# Patient Record
Sex: Male | Born: 1975 | Race: Black or African American | Hispanic: No | Smoking: Current every day smoker
Health system: Southern US, Community
[De-identification: ages and names within clinical notes are randomized; demographics above are authoritative.]

---

## 2020-11-12 ENCOUNTER — Ambulatory Visit
Admission: EM | Admit: 2020-11-12 | Discharge: 2020-11-12 | Disposition: A | Discharge status: Home / Self Care | Payer: 59 | Attending: Emergency Medicine | Admitting: Emergency Medicine

## 2020-11-12 DIAGNOSIS — R109 Unspecified abdominal pain: Secondary | ICD-10-CM | POA: Diagnosis not present

## 2020-11-12 MED ORDER — DICYCLOMINE HCL 20 MG PO TABS: 20.0000 mg | ORAL_TABLET | Freq: Three times a day (TID) | ORAL | 0 refills | Status: DC

## 2020-11-12 NOTE — ED Triage Notes (Signed)
Pt present abdominal pain with diarrhea. Pt states symptom started  4 days ago. Pt states that his stool has been crazy.

## 2020-11-12 NOTE — ED Provider Notes (Signed)
[facility]    CSN: [id number] Arrival date & time: 11/12/20  1413      History   Chief Complaint Chief Complaint  Patient presents with  . Abdominal Pain    HPI Justin Meadows is a 45 y.o. male presenting today for evaluation of abdominal pain.  Reports over the past 4 to 5 days has had intermittent abdominal pain and cramping and discomfort associated with the urge to defecate.  Reports that he has had a lot of gas as well as mucousy stools.  Reports symptoms began after drinking Gatorade earlier in the week, but otherwise denies any other abnormal oral intakes.  Denies any nausea or vomiting.  Appetite has been slightly decreased as well as reports associated chills.  Denies fevers.  Denies history of GI problems.  HPI  History reviewed. No pertinent past medical history.  There are no problems to display for this patient.   History reviewed. No pertinent surgical history.     Home Medications    Prior to Admission medications   Medication Sig Start Date End Date Taking? Authorizing Provider  dicyclomine (BENTYL) 20 MG tablet Take 1 tablet (20 mg total) by mouth 4 (four) times daily -  before meals and at bedtime. 11/12/20  Yes Tabbetha Kutscher, Junius Creamer, PA-C    Family History History reviewed. No pertinent family history.  Social History     Allergies   Patient has no known allergies.   Review of Systems Review of Systems  Constitutional: Negative for activity change, appetite change, chills, fatigue and fever.  HENT: Negative for congestion, ear pain, rhinorrhea, sinus pressure, sore throat and trouble swallowing.   Eyes: Negative for discharge and redness.  Respiratory: Negative for cough, chest tightness and shortness of breath.   Cardiovascular: Negative for chest pain.  Gastrointestinal: Positive for abdominal pain. Negative for diarrhea, nausea and vomiting.  Musculoskeletal: Negative for myalgias.  Skin: Negative for rash.  Neurological: Negative  for dizziness, light-headedness and headaches.     Physical Exam Triage Vital Signs ED Triage Vitals  Enc Vitals Group     BP      Pulse      Resp      Temp      Temp src      SpO2      Weight      Height      Head Circumference      Peak Flow      Pain Score      Pain Loc      Pain Edu?      Excl. in GC?    No data found.  Updated Vital Signs BP (!) 150/91 (BP Location: Left Arm)   Pulse 90   Temp 99.2 F (37.3 C) (Oral)   Resp 16   SpO2 97%   Visual Acuity Right Eye Distance:   Left Eye Distance:   Bilateral Distance:    Right Eye Near:   Left Eye Near:    Bilateral Near:     Physical Exam Vitals and nursing note reviewed.  Constitutional:      Appearance: He is well-developed.     Comments: No acute distress  HENT:     Head: Normocephalic and atraumatic.     Nose: Nose normal.  Eyes:     Conjunctiva/sclera: Conjunctivae normal.  Cardiovascular:     Rate and Rhythm: Normal rate and regular rhythm.  Pulmonary:     Effort: Pulmonary effort is normal. No respiratory distress.  Comments: Breathing comfortably at rest, CTABL, no wheezing, rales or other adventitious sounds auscultated Abdominal:     General: There is no distension.     Comments: Soft, nondistended, nontender to light deep palpation throughout abdomen  Musculoskeletal:        General: Normal range of motion.     Cervical back: Neck supple.  Skin:    General: Skin is warm and dry.  Neurological:     Mental Status: He is alert and oriented to person, place, and time.      UC Treatments / Results  Labs (all labs ordered are listed, but only abnormal results are displayed) Labs Reviewed - No data to display  EKG   Radiology No results found.  Procedures Procedures (including critical care time)  Medications Ordered in UC Medications - No data to display  Initial Impression / Assessment and Plan / UC Course  I have reviewed the triage vital signs and the nursing  notes.  Pertinent labs & imaging results that were available during my care of the patient were reviewed by me and considered in my medical decision making (see chart for details).     Abdominal cramping with mucousy stools-no abdominal tenderness on exam, do not suspect abdominal emergency, possible functional cause versus other viral cause causing aberration in stools.  Recommending continued supportive care, trial of Bentyl, recommended returning stool sample to rule out other infections causes contributing to symptoms and if symptoms still not resolving over the next 2 to 3 days.  May consider GI follow-up if persisting times weeks to month.  Discussed strict return precautions. Patient verbalized understanding and is agreeable with plan.  Final Clinical Impressions(s) / UC Diagnoses   Final diagnoses:  Abdominal cramping     Discharge Instructions     Please return stool sample if symptoms not resolving over the next 2 to 3 days May try Bentyl 3-4 times daily as needed for cramping Drink plenty of fluids Follow-up if not improving or worsening    ED Prescriptions    Medication Sig Dispense Auth. Provider   dicyclomine (BENTYL) 20 MG tablet Take 1 tablet (20 mg total) by mouth 4 (four) times daily -  before meals and at bedtime. 16 tablet Jabril Pursell, [place] C, PA-C     PDMP not reviewed this encounter.   Lew Dawes, PA-C 11/12/20 1620

## 2020-11-12 NOTE — Discharge Instructions (Signed)
Please return stool sample if symptoms not resolving over the next 2 to 3 days May try Bentyl 3-4 times daily as needed for cramping Drink plenty of fluids Follow-up if not improving or worsening

## 2020-11-14 ENCOUNTER — Inpatient Hospital Stay (HOSPITAL_BASED_OUTPATIENT_CLINIC_OR_DEPARTMENT_OTHER)
Admission: EM | Admit: 2020-11-14 | Discharge: 2020-11-18 | DRG: 391 | Disposition: A | Discharge status: Home / Self Care | Payer: 59 | Attending: Internal Medicine | Admitting: Internal Medicine

## 2020-11-14 ENCOUNTER — Emergency Department (HOSPITAL_BASED_OUTPATIENT_CLINIC_OR_DEPARTMENT_OTHER): Payer: 59

## 2020-11-14 ENCOUNTER — Other Ambulatory Visit: Payer: Self-pay

## 2020-11-14 ENCOUNTER — Encounter (HOSPITAL_BASED_OUTPATIENT_CLINIC_OR_DEPARTMENT_OTHER): Payer: Self-pay | Admitting: Emergency Medicine

## 2020-11-14 DIAGNOSIS — Z20822 Contact with and (suspected) exposure to covid-19: Secondary | ICD-10-CM | POA: Diagnosis not present

## 2020-11-14 DIAGNOSIS — K651 Peritoneal abscess: Secondary | ICD-10-CM | POA: Diagnosis present

## 2020-11-14 DIAGNOSIS — F17210 Nicotine dependence, cigarettes, uncomplicated: Secondary | ICD-10-CM | POA: Diagnosis present

## 2020-11-14 DIAGNOSIS — D509 Iron deficiency anemia, unspecified: Secondary | ICD-10-CM | POA: Diagnosis not present

## 2020-11-14 DIAGNOSIS — R739 Hyperglycemia, unspecified: Secondary | ICD-10-CM | POA: Diagnosis not present

## 2020-11-14 DIAGNOSIS — E876 Hypokalemia: Secondary | ICD-10-CM | POA: Diagnosis not present

## 2020-11-14 DIAGNOSIS — R109 Unspecified abdominal pain: Secondary | ICD-10-CM

## 2020-11-14 DIAGNOSIS — R1030 Lower abdominal pain, unspecified: Secondary | ICD-10-CM | POA: Diagnosis present

## 2020-11-14 DIAGNOSIS — L0291 Cutaneous abscess, unspecified: Secondary | ICD-10-CM

## 2020-11-14 DIAGNOSIS — K50114 Crohn's disease of large intestine with abscess: Secondary | ICD-10-CM

## 2020-11-14 DIAGNOSIS — K5720 Diverticulitis of large intestine with perforation and abscess without bleeding: Principal | ICD-10-CM | POA: Diagnosis present

## 2020-11-14 DIAGNOSIS — R1032 Left lower quadrant pain: Secondary | ICD-10-CM

## 2020-11-14 LAB — COMPREHENSIVE METABOLIC PANEL
ALT: 27 U/L (ref 0–44)
AST: 17 U/L (ref 15–41)
Albumin: 3.4 g/dL — ABNORMAL LOW (ref 3.5–5.0)
Alkaline Phosphatase: 65 U/L (ref 38–126)
Anion gap: 11 (ref 5–15)
BUN: 8 mg/dL (ref 6–20)
CO2: 26 mmol/L (ref 22–32)
Calcium: 8.5 mg/dL — ABNORMAL LOW (ref 8.9–10.3)
Chloride: 100 mmol/L (ref 98–111)
Creatinine, Ser: 0.86 mg/dL (ref 0.61–1.24)
GFR, Estimated: >60 mL/min (ref >60)
Glucose, Bld: 155 mg/dL — ABNORMAL HIGH (ref 70–99)
Potassium: 2.7 mmol/L — CL (ref 3.5–5.1)
Sodium: 137 mmol/L (ref 135–145)
Total Bilirubin: 0.4 mg/dL (ref 0.3–1.2)
Total Protein: 7.8 g/dL (ref 6.5–8.1)

## 2020-11-14 LAB — IRON AND TIBC
Iron: 18 ug/dL — ABNORMAL LOW (ref 45–182)
Saturation Ratios: 8 % — ABNORMAL LOW (ref 17.9–39.5)
TIBC: 227 ug/dL — ABNORMAL LOW (ref 250–450)
UIBC: 209 ug/dL

## 2020-11-14 LAB — CBC WITH DIFFERENTIAL/PLATELET
Abs Immature Granulocytes: 0.21 10*3/uL — ABNORMAL HIGH (ref 0.00–0.07)
Basophils Absolute: 0.1 10*3/uL (ref 0.0–0.1)
Basophils Relative: 0 %
Eosinophils Absolute: 0 10*3/uL (ref 0.0–0.5)
Eosinophils Relative: 0 %
HCT: 38.2 % — ABNORMAL LOW (ref 39.0–52.0)
Hemoglobin: 12.8 g/dL — ABNORMAL LOW (ref 13.0–17.0)
Immature Granulocytes: 1 %
Lymphocytes Relative: 5 %
Lymphs Abs: 0.9 10*3/uL (ref 0.7–4.0)
MCH: 29.4 pg (ref 26.0–34.0)
MCHC: 33.5 g/dL (ref 30.0–36.0)
MCV: 87.8 fL (ref 80.0–100.0)
Monocytes Absolute: 2.3 10*3/uL — ABNORMAL HIGH (ref 0.1–1.0)
Monocytes Relative: 11 %
Neutro Abs: 17.1 10*3/uL — ABNORMAL HIGH (ref 1.7–7.7)
Neutrophils Relative %: 83 %
Platelets: 327 10*3/uL (ref 150–400)
RBC: 4.35 MIL/uL (ref 4.22–5.81)
RDW: 13.5 % (ref 11.5–15.5)
WBC: 20.6 10*3/uL — ABNORMAL HIGH (ref 4.0–10.5)
nRBC: 0 % (ref 0.0–0.2)

## 2020-11-14 LAB — RESP PANEL BY RT-PCR (FLU A&B, COVID) ARPGX2
Influenza A by PCR: NEGATIVE
Influenza B by PCR: NEGATIVE
SARS Coronavirus 2 by RT PCR: NEGATIVE

## 2020-11-14 LAB — URINALYSIS, ROUTINE W REFLEX MICROSCOPIC
Bilirubin Urine: NEGATIVE
Glucose, UA: NEGATIVE mg/dL
Ketones, ur: NEGATIVE mg/dL
Leukocytes,Ua: NEGATIVE
Nitrite: NEGATIVE
Protein, ur: NEGATIVE mg/dL
Specific Gravity, Urine: <1.005 — ABNORMAL LOW (ref 1.005–1.030)
pH: 6 (ref 5.0–8.0)

## 2020-11-14 LAB — URINALYSIS, MICROSCOPIC (REFLEX)

## 2020-11-14 LAB — MAGNESIUM: Magnesium: 1.8 mg/dL (ref 1.7–2.4)

## 2020-11-14 LAB — C DIFFICILE QUICK SCREEN W PCR REFLEX
C Diff antigen: NEGATIVE
C Diff interpretation: NOT DETECTED
C Diff toxin: NEGATIVE

## 2020-11-14 LAB — HIV ANTIBODY (ROUTINE TESTING W REFLEX): HIV Screen 4th Generation wRfx: NONREACTIVE

## 2020-11-14 MED ORDER — ONDANSETRON HCL 4 MG/2ML IJ SOLN
4.0000 mg | Freq: Once | INTRAMUSCULAR | Status: AC
  Administered 2020-11-14: 4 mg via INTRAVENOUS
  Filled 2020-11-14: qty 2

## 2020-11-14 MED ORDER — ONDANSETRON HCL 4 MG PO TABS
4.0000 mg | ORAL_TABLET | Freq: Four times a day (QID) | ORAL | Status: DC | PRN
Start: 2020-11-14 — End: 2020-11-18

## 2020-11-14 MED ORDER — OXYCODONE HCL 5 MG PO TABS
5.0000 mg | ORAL_TABLET | Freq: Four times a day (QID) | ORAL | Status: DC | PRN
  Administered 2020-11-14 – 2020-11-16 (×4): 5 mg via ORAL
  Filled 2020-11-14 (×4): qty 1

## 2020-11-14 MED ORDER — SODIUM CHLORIDE 0.9 % IV SOLN: Freq: Once | INTRAVENOUS | Status: AC

## 2020-11-14 MED ORDER — SODIUM CHLORIDE 0.9 % IV BOLUS
1000.0000 mL | Freq: Once | INTRAVENOUS | Status: AC
  Administered 2020-11-14: 1000 mL via INTRAVENOUS

## 2020-11-14 MED ORDER — ONDANSETRON HCL 4 MG/2ML IJ SOLN: 4.0000 mg | Freq: Four times a day (QID) | INTRAMUSCULAR | Status: DC | PRN

## 2020-11-14 MED ORDER — METRONIDAZOLE 500 MG/100ML IV SOLN
500.0000 mg | Freq: Once | INTRAVENOUS | Status: AC
  Administered 2020-11-14: 500 mg via INTRAVENOUS
  Filled 2020-11-14: qty 100

## 2020-11-14 MED ORDER — POTASSIUM CHLORIDE CRYS ER 20 MEQ PO TBCR
20.0000 meq | EXTENDED_RELEASE_TABLET | Freq: Two times a day (BID) | ORAL | Status: AC
  Administered 2020-11-14 – 2020-11-15 (×3): 20 meq via ORAL
  Filled 2020-11-14 (×3): qty 1

## 2020-11-14 MED ORDER — POTASSIUM CHLORIDE 10 MEQ/100ML IV SOLN
10.0000 meq | INTRAVENOUS | Status: AC
  Administered 2020-11-14 (×6): 10 meq via INTRAVENOUS
  Filled 2020-11-14 (×6): qty 100

## 2020-11-14 MED ORDER — POTASSIUM CHLORIDE CRYS ER 20 MEQ PO TBCR
40.0000 meq | EXTENDED_RELEASE_TABLET | Freq: Once | ORAL | Status: AC
  Administered 2020-11-14: 40 meq via ORAL
  Filled 2020-11-14: qty 2

## 2020-11-14 MED ORDER — ACETAMINOPHEN 325 MG PO TABS
650.0000 mg | ORAL_TABLET | Freq: Four times a day (QID) | ORAL | Status: DC | PRN
  Administered 2020-11-14 – 2020-11-15 (×3): 650 mg via ORAL
  Filled 2020-11-14 (×3): qty 2

## 2020-11-14 MED ORDER — IOHEXOL 300 MG/ML  SOLN
100.0000 mL | Freq: Once | INTRAMUSCULAR | Status: AC | PRN
  Administered 2020-11-14: 100 mL via INTRAVENOUS

## 2020-11-14 MED ORDER — ONDANSETRON HCL 4 MG/2ML IJ SOLN: 4.0000 mg | Freq: Three times a day (TID) | INTRAMUSCULAR | Status: DC | PRN

## 2020-11-14 MED ORDER — SODIUM CHLORIDE 0.9 % IV SOLN
2.0000 g | Freq: Three times a day (TID) | INTRAVENOUS | Status: DC
  Administered 2020-11-14 – 2020-11-17 (×9): 2 g via INTRAVENOUS
  Filled 2020-11-14 (×10): qty 2

## 2020-11-14 MED ORDER — FENTANYL CITRATE (PF) 100 MCG/2ML IJ SOLN
100.0000 ug | INTRAMUSCULAR | Status: DC | PRN
  Administered 2020-11-14 – 2020-11-15 (×6): 100 ug via INTRAVENOUS
  Filled 2020-11-14 (×6): qty 2

## 2020-11-14 MED ORDER — SODIUM CHLORIDE 0.9 % IV SOLN
INTRAVENOUS | Status: DC | PRN
  Administered 2020-11-14: 1000 mL via INTRAVENOUS

## 2020-11-14 MED ORDER — METRONIDAZOLE 500 MG/100ML IV SOLN
500.0000 mg | Freq: Three times a day (TID) | INTRAVENOUS | Status: DC
  Administered 2020-11-14 – 2020-11-17 (×9): 500 mg via INTRAVENOUS
  Filled 2020-11-14 (×9): qty 100

## 2020-11-14 MED ORDER — SODIUM CHLORIDE 0.9 % IV SOLN
2.0000 g | Freq: Once | INTRAVENOUS | Status: AC
  Administered 2020-11-14: 2 g via INTRAVENOUS
  Filled 2020-11-14: qty 2

## 2020-11-14 MED ORDER — POTASSIUM CHLORIDE 10 MEQ/100ML IV SOLN
10.0000 meq | INTRAVENOUS | Status: AC
  Administered 2020-11-14 (×4): 10 meq via INTRAVENOUS
  Filled 2020-11-14 (×4): qty 100

## 2020-11-14 MED ORDER — FENTANYL CITRATE (PF) 100 MCG/2ML IJ SOLN
100.0000 ug | Freq: Once | INTRAMUSCULAR | Status: AC
  Administered 2020-11-14: 100 ug via INTRAVENOUS
  Filled 2020-11-14: qty 2

## 2020-11-14 NOTE — Progress Notes (Signed)
Pharmacy Antibiotic Note  Justin Meadows is a 45 y.o. male admitted on 11/14/2020 with intra-abdominal infection.  Pharmacy has been consulted for Cefepime dosing.  Plan: Cefepime 2g IV q8h Metronidazole per MD. Follow up renal function, culture results, and clinical course.  Height: 5\' 9"  (175.3 cm) Weight: 74.8 kg (165 lb) IBW/kg (Calculated) : 70.7  Temp (24hrs), Avg:99.7 F (37.6 C), Min:99 F (37.2 C), Max:100.1 F (37.8 C)  Recent Labs  Lab 11/14/20 0410  WBC 20.6*  CREATININE 0.86    Estimated Creatinine Clearance: 108.5 mL/min (by C-G formula based on SCr of 0.86 mg/dL).    No Known Allergies  Antimicrobials this admission: 5/29 Cefepime >>   Dose adjustments this admission:   Microbiology results:  Thank you for allowing pharmacy to be a part of this patient's care.  6/29 PharmD, BCPS Clinical Pharmacist [facility] main pharmacy [phone] 11/14/2020 2:13 PM

## 2020-11-14 NOTE — H&P (Signed)
History and Physical    Justin Meadows JYN:[id number] DOB: [birth year] DOA: 11/14/2020  PCP: Pcp, No  Patient coming from: Home  Chief Complaint: abdominal pain  HPI: Justin Meadows is a 45 y.o. male with no significant medical history. Presenting with abdominal cramping. Symptoms have been present for the last 5 days. It's located in his lower quadrants. It seems to be constant but varies in intensity. At its worst, it's 10/10. He visited urgent care for help yesterday. He was sent home with bentyl. This did not provide relief. He continued to have crampy pain, so he came to the ED for help. He denies any other aggravating or alleviating factors.   ED Course: CT showed pelvic abscess. He was started on flagyl, cefepime. [facility] was called for admission.   Review of Systems:  Denies CP, palpitations, fever, dyspnea.  Review of systems is otherwise negative for all not mentioned in HPI.   PMHx History reviewed. No pertinent past medical history.  PSHx History reviewed. No pertinent surgical history.  SocHx  reports that he has been smoking cigarettes. He has never used smokeless tobacco. He reports current alcohol use. He reports current drug use. Drug: Marijuana.  No Known Allergies  FamHx No family history on file.  Prior to Admission medications   Medication Sig Start Date End Date Taking? Authorizing Provider  dicyclomine (BENTYL) 20 MG tablet Take 1 tablet (20 mg total) by mouth 4 (four) times daily -  before meals and at bedtime. 11/12/20  Yes Wieters, Hallie C, PA-C  ibuprofen (ADVIL) 200 MG tablet Take 400-600 mg by mouth every 6 (six) hours as needed for fever, headache or mild pain.   Yes [provider]    Physical Exam: Vitals:   11/14/20 0930 11/14/20 1015 11/14/20 1144 11/14/20 1343  BP: 110/70 118/79 (!) 142/82 133/86  Pulse: 81 79 76 86  Resp: 20 20 18 18   Temp:   99.8 F (37.7 C) 100 F (37.8 C)  TempSrc:   Oral Oral  SpO2: 96% 96% 99% 98%  Weight:       Height:        General: 45 y.o. male resting in bed in NAD Eyes: PERRL, normal sclera ENMT: Nares patent w/o discharge, orophaynx clear, dentition normal, ears w/o discharge/lesions/ulcers Neck: Supple, trachea midline Cardiovascular: RRR, +S1, S2, no m/g/r, equal pulses throughout Respiratory: CTABL, no w/r/r, normal WOB GI: BS+, ND, RLQ/LLQ TTP, no masses noted, no organomegaly noted MSK: No e/c/c Skin: No rashes, bruises, ulcerations noted Neuro: A&O x 3, no focal deficits Psyc: Appropriate interaction and affect, calm/cooperative  Labs on Admission: I have personally reviewed following labs and imaging studies  CBC: Recent Labs  Lab 11/14/20 0410  WBC 20.6*  NEUTROABS 17.1*  HGB 12.8*  HCT 38.2*  MCV 87.8  PLT 327   Basic Metabolic Panel: Recent Labs  Lab 11/14/20 0410  NA 137  K 2.7*  CL 100  CO2 26  GLUCOSE 155*  BUN 8  CREATININE 0.86  CALCIUM 8.5*   GFR: Estimated Creatinine Clearance: 108.5 mL/min (by C-G formula based on SCr of 0.86 mg/dL). Liver Function Tests: Recent Labs  Lab 11/14/20 0410  AST 17  ALT 27  ALKPHOS 65  BILITOT 0.4  PROT 7.8  ALBUMIN 3.4*   No results for input(s): LIPASE, AMYLASE in the last 168 hours. No results for input(s): AMMONIA in the last 168 hours. Coagulation Profile: No results for input(s): INR, PROTIME in the last 168 hours. Cardiac Enzymes: No  results for input(s): CKTOTAL, CKMB, CKMBINDEX, TROPONINI in the last 168 hours. BNP (last 3 results) No results for input(s): PROBNP in the last 8760 hours. HbA1C: No results for input(s): HGBA1C in the last 72 hours. CBG: No results for input(s): GLUCAP in the last 168 hours. Lipid Profile: No results for input(s): CHOL, HDL, LDLCALC, TRIG, CHOLHDL, LDLDIRECT in the last 72 hours. Thyroid Function Tests: No results for input(s): TSH, T4TOTAL, FREET4, T3FREE, THYROIDAB in the last 72 hours. Anemia Panel: No results for input(s): VITAMINB12, FOLATE, FERRITIN,  TIBC, IRON, RETICCTPCT in the last 72 hours. Urine analysis:    Component Value Date/Time   COLORURINE YELLOW 11/14/2020 0630   APPEARANCEUR CLEAR 11/14/2020 0630   LABSPEC <1.005 (L) 11/14/2020 0630   PHURINE 6.0 11/14/2020 0630   GLUCOSEU NEGATIVE 11/14/2020 0630   HGBUR SMALL (A) 11/14/2020 0630   BILIRUBINUR NEGATIVE 11/14/2020 0630   KETONESUR NEGATIVE 11/14/2020 0630   PROTEINUR NEGATIVE 11/14/2020 0630   NITRITE NEGATIVE 11/14/2020 0630   LEUKOCYTESUR NEGATIVE 11/14/2020 0630    Radiological Exams on Admission: CT ABDOMEN PELVIS W CONTRAST  Result Date: 11/14/2020 CLINICAL DATA:  Abdominal pain EXAM: CT ABDOMEN AND PELVIS WITH CONTRAST TECHNIQUE: Multidetector CT imaging of the abdomen and pelvis was performed using the standard protocol following bolus administration of intravenous contrast. CONTRAST:  OMNIPAQUE IOHEXOL 300 MG/ML  SOLN COMPARISON:  None. FINDINGS: Lower chest: Lung bases are clear. Hepatobiliary: 2.0 cm subcapsular cyst in segment 4 (series 2/image 27). Gallbladder is unremarkable. No intrahepatic or extrahepatic ductal dilatation. Pancreas: Within normal limits. Spleen: Within normal limits. Adrenals/Urinary Tract: Adrenal glands are within normal limits. Right renal cysts, measuring up to 2.2 cm in the lower pole (series 2/image 43). Left kidney is within normal limits. No hydronephrosis. Mildly thick-walled bladder, although underdistended. Stomach/Bowel: Stomach is within normal limits. No evidence of bowel obstruction. Normal appendix (series 2/image 63). Possible mild wall thickening involving the ascending colon (series 2/image 54), although equivocal. Transverse and proximal descending colon are normal. However, there is wall thickening involving the rectosigmoid colon with an associated 7.9 x 6.9 x 6.9 cm pelvic abscess (series 2/image 77). This distorts the adjacent loops of colon and likely communicates with one of the loops, although the direct  communication is not well visualized the current CT. Vascular/Lymphatic: No evidence of abdominal aortic aneurysm. Atherosclerotic calcifications of the abdominal aorta and branch vessels. No suspicious abdominopelvic lymphadenopathy. Reproductive: Prostate is unremarkable. Other: Mild lower retroperitoneal/pelvic stranding. No free air. Musculoskeletal: Visualized osseous structures are within normal limits. IMPRESSION: Suspected 7.9 cm pelvic abscess with adjacent rectosigmoid colonic wall thickening, likely related to infectious/inflammatory colitis. No associated free air. These results were called by telephone at the time of interpretation on 11/14/2020 at 6:00 am to provider [facility] , who verbally acknowledged these results. Electronically Signed   By: Charline Bills M.D.   On: 11/14/2020 06:20    EKG: None obtained in ED  Assessment/Plan Rectosigmoid colitis w/ pelvic abscess     - placed in obs, med-surg     - continue cefepime, flagyl     - general surgery has seen, rec IR drain, appreciate assistance, IR consulted     - pain control, fluids, anti-emetics     - ok for CLD  Tobacco abuse Marijuana abuse     - counsel against further use     - nicotine patch on request  Hypokalemia     - replace K+, check Mg2+  Normocytic anemia     -  check iron studies  DVT prophylaxis: SCDs  Code Status: FULL  Family Communication: None at bedside  Consults called: Gen surg, IR   Status is: Observation  The patient remains OBS appropriate and will d/c before 2 midnights.  Dispo: The patient is from: Home              Anticipated d/c is to: Home              Patient currently is not medically stable to d/c.   Difficult to place patient No  Time spent coordinating admission: 70 minutes  Mattox Schorr A Tauheedah Bok DO [facility]  If 7PM-7AM, please contact night-coverage [email or web address]  11/14/2020, 2:09 PM

## 2020-11-14 NOTE — ED Triage Notes (Signed)
Reports abd pain, diarrhea and gas x 5-6 days. Denies sick contacts, no one else in the home has sx and no recent travel. Temp on arrival 100.1. Denies nausea or vomiting but states he has had little intake due to abd pain.

## 2020-11-14 NOTE — Progress Notes (Signed)
Pt arrived to floor in stable condition. Pt medicated for pain shortly after arrival to floor. Assessment and VS performed. No s/s of distress. Dr. Ronaldo Miyamoto saw pt in room. Surgery to also see pt as well. Rn will keep pt npo until surgery clears pt to eat/drink. Rn will continue to monitor and medicate for needs.

## 2020-11-14 NOTE — Consult Note (Signed)
Reason for Consult: Pelvic abscess Referring Physician: Ronaldo Miyamoto MD  Justin Meadows is an 45 y.o. male.  HPI: Patient transferred from [facility] emergency room after being seen there yesterday and last night for pelvic pain.  He has felt sick of the last week.  Describes lower abdominal pain for 1 week.  Pain is sharp in nature and is progressed since this started about a week ago.  Location is suprapubic radiating down to his pelvis.  Sharp in nature made worse with movement.  Upon his evaluation in the emergency room, he had a CT scan which showed a large pelvic abscess.  No history of Crohn's disease or inflammatory bowel disease.  No history of colon cancer in his family or himself.  No history of diverticulosis.  He has never had a colonoscopy.  He has very little appetite.  Some rectal bleeding which is quite minimal.  No diarrhea.  History reviewed. No pertinent past medical history.  History reviewed. No pertinent surgical history.  No family history on file.  Social History:  reports that he has been smoking cigarettes. He has never used smokeless tobacco. He reports current alcohol use. He reports current drug use. Drug: Marijuana.  Allergies: No Known Allergies  Medications: I have reviewed the patient's current medications.  Results for orders placed or performed during the hospital encounter of 11/14/20 (from the past 48 hour(s))  CBC with Differential/Platelet     Status: Abnormal   Collection Time: 11/14/20  4:10 AM  Result Value Ref Range   WBC 20.6 (H) 4.0 - 10.5 K/uL   RBC 4.35 4.22 - 5.81 MIL/uL   Hemoglobin 12.8 (L) 13.0 - 17.0 g/dL   HCT 78.2 (L) 95.6 - 21.3 %   MCV 87.8 80.0 - 100.0 fL   MCH 29.4 26.0 - 34.0 pg   MCHC 33.5 30.0 - 36.0 g/dL   RDW 08.6 57.8 - 46.9 %   Platelets 327 150 - 400 K/uL   nRBC 0.0 0.0 - 0.2 %   Neutrophils Relative % 83 %   Neutro Abs 17.1 (H) 1.7 - 7.7 K/uL   Lymphocytes Relative 5 %   Lymphs Abs 0.9 0.7 - 4.0 K/uL   Monocytes Relative 11  %   Monocytes Absolute 2.3 (H) 0.1 - 1.0 K/uL   Eosinophils Relative 0 %   Eosinophils Absolute 0.0 0.0 - 0.5 K/uL   Basophils Relative 0 %   Basophils Absolute 0.1 0.0 - 0.1 K/uL   Immature Granulocytes 1 %   Abs Immature Granulocytes 0.21 (H) 0.00 - 0.07 K/uL    Comment: Performed at [facility], [address] [place], [place] [address]  Comprehensive metabolic panel     Status: Abnormal   Collection Time: 11/14/20  4:10 AM  Result Value Ref Range   Sodium 137 135 - 145 mmol/L   Potassium 2.7 (LL) 3.5 - 5.1 mmol/L    Comment: CRITICAL RESULT CALLED TO, READ BACK BY AND VERIFIED WITH: VALORIE POWELL RN ON 11/14/20 AT 0454 [facility]    Chloride 100 98 - 111 mmol/L   CO2 26 22 - 32 mmol/L   Glucose, Bld 155 (H) 70 - 99 mg/dL    Comment: Glucose reference range applies only to samples taken after fasting for at least 8 hours.   BUN 8 6 - 20 mg/dL   Creatinine, Ser 8.41 0.61 - 1.24 mg/dL   Calcium 8.5 (L) 8.9 - 10.3 mg/dL   Total Protein 7.8 6.5 - 8.1  g/dL   Albumin 3.4 (L) 3.5 - 5.0 g/dL   AST 17 15 - 41 U/L   ALT 27 0 - 44 U/L   Alkaline Phosphatase 65 38 - 126 U/L   Total Bilirubin 0.4 0.3 - 1.2 mg/dL   GFR, Estimated >96 >78 mL/min    Comment: (NOTE) Calculated using the CKD-EPI Creatinine Equation (2021)    Anion gap 11 5 - 15    Comment: Performed at [facility], [address] [place], [place] [address]  Resp Panel by RT-PCR (Flu A&B, Covid)     Status: None   Collection Time: 11/14/20  6:18 AM   Specimen: Nasopharyngeal(NP) swabs in vial transport medium  Result Value Ref Range   SARS Coronavirus 2 by RT PCR NEGATIVE NEGATIVE    Comment: (NOTE) SARS-CoV-2 target nucleic acids are NOT DETECTED.  The SARS-CoV-2 RNA is generally detectable in upper respiratory specimens during the acute phase of infection. The lowest concentration of SARS-CoV-2 viral copies this assay can detect is 138 copies/mL. A negative result does not preclude  SARS-Cov-2 infection and should not be used as the sole basis for treatment or other patient management decisions. A negative result may occur with  improper specimen collection/handling, submission of specimen other than nasopharyngeal swab, presence of viral mutation(s) within the areas targeted by this assay, and inadequate number of viral copies(<138 copies/mL). A negative result must be combined with clinical observations, patient history, and epidemiological information. The expected result is Negative.  Fact Sheet for Patients:  BloggerCourse.com  Fact Sheet for Healthcare Providers:  SeriousBroker.it  This test is no t yet approved or cleared by the [place] FDA and  has been authorized for detection and/or diagnosis of SARS-CoV-2 by FDA under an Emergency Use Authorization (EUA). This EUA will remain  in effect (meaning this test can be used) for the duration of the COVID-19 declaration under Section 564(b)(1) of the Act, 21 U.S.C.section 360bbb-3(b)(1), unless the authorization is terminated  or revoked sooner.       Influenza A by PCR NEGATIVE NEGATIVE   Influenza B by PCR NEGATIVE NEGATIVE    Comment: (NOTE) The Xpert Xpress SARS-CoV-2/FLU/RSV plus assay is intended as an aid in the diagnosis of influenza from Nasopharyngeal swab specimens and should not be used as a sole basis for treatment. Nasal washings and aspirates are unacceptable for Xpert Xpress SARS-CoV-2/FLU/RSV testing.  Fact Sheet for Patients: BloggerCourse.com  Fact Sheet for Healthcare Providers: SeriousBroker.it  This test is not yet approved or cleared by the [place] FDA and has been authorized for detection and/or diagnosis of SARS-CoV-2 by FDA under an Emergency Use Authorization (EUA). This EUA will remain in effect (meaning this test can be used) for the duration of the COVID-19  declaration under Section 564(b)(1) of the Act, 21 U.S.C. section 360bbb-3(b)(1), unless the authorization is terminated or revoked.  Performed at [facility], [address] [place], [place] [address]   Urinalysis, Routine w reflex microscopic     Status: Abnormal   Collection Time: 11/14/20  6:30 AM  Result Value Ref Range   Color, Urine YELLOW YELLOW   APPearance CLEAR CLEAR   Specific Gravity, Urine <1.005 (L) 1.005 - 1.030   pH 6.0 5.0 - 8.0   Glucose, UA NEGATIVE NEGATIVE mg/dL   Hgb urine dipstick SMALL (A) NEGATIVE   Bilirubin Urine NEGATIVE NEGATIVE   Ketones, ur NEGATIVE NEGATIVE mg/dL   Protein, ur NEGATIVE NEGATIVE mg/dL  Nitrite NEGATIVE NEGATIVE   Leukocytes,Ua NEGATIVE NEGATIVE    Comment: Performed at [facility], [address] [place], [place] [address]  Urinalysis, Microscopic (reflex)     Status: Abnormal   Collection Time: 11/14/20  6:30 AM  Result Value Ref Range   RBC / HPF 0-5 0 - 5 RBC/hpf   WBC, UA 0-5 0 - 5 WBC/hpf   Bacteria, UA RARE (A) NONE SEEN   Squamous Epithelial / LPF 0-5 0 - 5   Mucus PRESENT     Comment: Performed at [facility], [address] [place], [place] [address]    CT ABDOMEN PELVIS W CONTRAST  Result Date: 11/14/2020 CLINICAL DATA:  Abdominal pain EXAM: CT ABDOMEN AND PELVIS WITH CONTRAST TECHNIQUE: Multidetector CT imaging of the abdomen and pelvis was performed using the standard protocol following bolus administration of intravenous contrast. CONTRAST:  100mL OMNIPAQUE IOHEXOL 300 MG/ML  SOLN COMPARISON:  None. FINDINGS: Lower chest: Lung bases are clear. Hepatobiliary: 2.0 cm subcapsular cyst in segment 4 (series 2/image 27). Gallbladder is unremarkable. No intrahepatic or extrahepatic ductal dilatation. Pancreas: Within normal limits. Spleen: Within normal limits. Adrenals/Urinary Tract: Adrenal glands are within normal limits. Right renal cysts, measuring up to 2.2 cm in the lower pole  (series 2/image 43). Left kidney is within normal limits. No hydronephrosis. Mildly thick-walled bladder, although underdistended. Stomach/Bowel: Stomach is within normal limits. No evidence of bowel obstruction. Normal appendix (series 2/image 63). Possible mild wall thickening involving the ascending colon (series 2/image 54), although equivocal. Transverse and proximal descending colon are normal. However, there is wall thickening involving the rectosigmoid colon with an associated 7.9 x 6.9 x 6.9 cm pelvic abscess (series 2/image 77). This distorts the adjacent loops of colon and likely communicates with one of the loops, although the direct communication is not well visualized the current CT. Vascular/Lymphatic: No evidence of abdominal aortic aneurysm. Atherosclerotic calcifications of the abdominal aorta and branch vessels. No suspicious abdominopelvic lymphadenopathy. Reproductive: Prostate is unremarkable. Other: Mild lower retroperitoneal/pelvic stranding. No free air. Musculoskeletal: Visualized osseous structures are within normal limits. IMPRESSION: Suspected 7.9 cm pelvic abscess with adjacent rectosigmoid colonic wall thickening, likely related to infectious/inflammatory colitis. No associated free air. These results were called by telephone at the time of interpretation on 11/14/2020 at 6:00 am to provider [facility] , who verbally acknowledged these results. Electronically Signed   By: Charline BillsSriyesh  Krishnan M.D.   On: 11/14/2020 06:20    Review of Systems  Gastrointestinal: Positive for abdominal pain and blood in stool.  All other systems reviewed and are negative.  Blood pressure 133/86, pulse 86, temperature 100 F (37.8 C), temperature source Oral, resp. rate 18, height 5\' 9"  (1.753 m), weight 74.8 kg, SpO2 98 %. Physical Exam Constitutional:      Appearance: He is well-developed.  HENT:     Head: Normocephalic and atraumatic.  Cardiovascular:     Rate and Rhythm: Normal rate and  regular rhythm.  Pulmonary:     Effort: Pulmonary effort is normal.     Breath sounds: Normal breath sounds.  Abdominal:     General: There is no distension.     Tenderness: There is abdominal tenderness in the suprapubic area.     Hernia: No hernia is present.  Skin:    General: Skin is warm and dry.  Neurological:     General: No focal deficit present.     Mental Status: He is alert.  Psychiatric:  Mood and Affect: Mood normal.        Behavior: Behavior normal.     Assessment/Plan: Pelvic abscess-most likely from diverticulitis at this point time.  Recommend IR consultation for percutaneous drainage if able  Recommend IV antibiotics  Okay to have clear liquids for now from our standpoint  No acute surgical need  Recommend colonoscopy in 6 to 8 weeks if he resolves without surgery  Surgery will follow while he is in house  Waverly A Dwan Hemmelgarn 11/14/2020, 1:50 PM

## 2020-11-14 NOTE — ED Notes (Signed)
Pt placed on cardiac monitor at this time. Pt tolerated well.

## 2020-11-14 NOTE — Progress Notes (Signed)
Chief Complaint: Abdominal Pain. Request is for pelvic abscess drain placement   Referring Physician(s):  Dr. Warren Lacy   Supervising Physician: Marliss Coots  Patient Status: [facility] - In-pt  History of Present Illness: Justin Meadows is a 45 y.o. male No pertinent medical history. Presented to the ED at [facility] with Persistent and worsening pelvic pain x 1 week with low grade fever. Patient was seen at an urgent care on 5.28.22 and prescribed bentyl with no improvement in pain. CT abd pelvis from 5.29.22 reads Suspected 7.9 cm pelvic abscess with adjacent rectosigmoid colonic wall thickening, likely related to infectious/inflammatory colitis. No associated free air. Team is requesting abscess drain placement to the pelvic fluid collection.  Patient lying bed. Endorses abdominal pain that he states is improving and chills. Denies any fevers, headache, chest pain, SOB, cough, abdominal pain, nausea, vomiting or bleeding. Patient made aware that the procedure may be aborted if there is no safe percutaneous window.  Return precautions and treatment recommendations and follow-up discussed with the patient who is agreeable with the plan.     History reviewed. No pertinent past medical history.  History reviewed. No pertinent surgical history.  Allergies: Patient has no known allergies.  Medications: Prior to Admission medications   Medication Sig Start Date End Date Taking? Authorizing Provider  dicyclomine (BENTYL) 20 MG tablet Take 1 tablet (20 mg total) by mouth 4 (four) times daily -  before meals and at bedtime. 11/12/20  Yes Wieters, Hallie C, PA-C  ibuprofen (ADVIL) 200 MG tablet Take 400-600 mg by mouth every 6 (six) hours as needed for fever, headache or mild pain.   Yes [provider]     No family history on file.  Social History   Socioeconomic History  . Marital status: Divorced    Spouse name: Not on file  . Number of children: Not on file  . Years of  education: Not on file  . Highest education level: Not on file  Occupational History  . Not on file  Tobacco Use  . Smoking status: Current Every Day Smoker    Types: Cigarettes  . Smokeless tobacco: Never Used  Vaping Use  . Vaping Use: Never used  Substance and Sexual Activity  . Alcohol use: Yes  . Drug use: Yes    Types: Marijuana  . Sexual activity: Not on file  Other Topics Concern  . Not on file  Social History Narrative  . Not on file   Social Determinants of Health   Financial Resource Strain: Not on file  Food Insecurity: Not on file  Transportation Needs: Not on file  Physical Activity: Not on file  Stress: Not on file  Social Connections: Not on file     Review of Systems: A 12 point ROS discussed and pertinent positives are indicated in the HPI above.  All other systems are negative.  Review of Systems  Constitutional: Positive for fever.  HENT: Negative for congestion.   Respiratory: Negative for cough and shortness of breath.   Cardiovascular: Negative for chest pain.  Gastrointestinal: Positive for abdominal pain (getting better).  Neurological: Negative for headaches.  Psychiatric/Behavioral: Negative for behavioral problems and confusion.    Vital Signs: BP 133/86 (BP Location: Left Arm)   Pulse 86   Temp 100 F (37.8 C) (Oral)   Resp 18   Ht 5\' 9"  (1.753 m)   Wt 165 lb (74.8 kg)   SpO2 98%   BMI 24.37  kg/m   Physical Exam Vitals and nursing note reviewed.  Constitutional:      Appearance: He is well-developed.  HENT:     Head: Normocephalic.  Cardiovascular:     Rate and Rhythm: Normal rate and regular rhythm.  Pulmonary:     Effort: Pulmonary effort is normal.     Breath sounds: Normal breath sounds.  Musculoskeletal:        General: Normal range of motion.     Cervical back: Normal range of motion.  Skin:    General: Skin is dry.  Neurological:     Mental Status: He is alert and oriented to person, place, and time.      Imaging: CT ABDOMEN PELVIS W CONTRAST  Result Date: 11/14/2020 CLINICAL DATA:  Abdominal pain EXAM: CT ABDOMEN AND PELVIS WITH CONTRAST TECHNIQUE: Multidetector CT imaging of the abdomen and pelvis was performed using the standard protocol following bolus administration of intravenous contrast. CONTRAST:  OMNIPAQUE IOHEXOL 300 MG/ML  SOLN COMPARISON:  None. FINDINGS: Lower chest: Lung bases are clear. Hepatobiliary: 2.0 cm subcapsular cyst in segment 4 (series 2/image 27). Gallbladder is unremarkable. No intrahepatic or extrahepatic ductal dilatation. Pancreas: Within normal limits. Spleen: Within normal limits. Adrenals/Urinary Tract: Adrenal glands are within normal limits. Right renal cysts, measuring up to 2.2 cm in the lower pole (series 2/image 43). Left kidney is within normal limits. No hydronephrosis. Mildly thick-walled bladder, although underdistended. Stomach/Bowel: Stomach is within normal limits. No evidence of bowel obstruction. Normal appendix (series 2/image 63). Possible mild wall thickening involving the ascending colon (series 2/image 54), although equivocal. Transverse and proximal descending colon are normal. However, there is wall thickening involving the rectosigmoid colon with an associated 7.9 x 6.9 x 6.9 cm pelvic abscess (series 2/image 77). This distorts the adjacent loops of colon and likely communicates with one of the loops, although the direct communication is not well visualized the current CT. Vascular/Lymphatic: No evidence of abdominal aortic aneurysm. Atherosclerotic calcifications of the abdominal aorta and branch vessels. No suspicious abdominopelvic lymphadenopathy. Reproductive: Prostate is unremarkable. Other: Mild lower retroperitoneal/pelvic stranding. No free air. Musculoskeletal: Visualized osseous structures are within normal limits. IMPRESSION: Suspected 7.9 cm pelvic abscess with adjacent rectosigmoid colonic wall thickening, likely related to  infectious/inflammatory colitis. No associated free air. These results were called by telephone at the time of interpretation on 11/14/2020 at 6:00 am to provider [facility] , who verbally acknowledged these results. Electronically Signed   By: Charline Bills M.D.   On: 11/14/2020 06:20    Labs:  CBC: Recent Labs    11/14/20 0410  WBC 20.6*  HGB 12.8*  HCT 38.2*  PLT 327    COAGS: No results for input(s): INR, APTT in the last 8760 hours.  BMP: Recent Labs    11/14/20 0410  NA 137  K 2.7*  CL 100  CO2 26  GLUCOSE 155*  BUN 8  CALCIUM 8.5*  CREATININE 0.86  GFRNONAA >60    LIVER FUNCTION TESTS: Recent Labs    11/14/20 0410  BILITOT 0.4  AST 17  ALT 27  ALKPHOS 65  PROT 7.8  ALBUMIN 3.4*    Assessment and Plan:  45 y.o. male inpatient. No pertinent medical history. Presented to the ED at [facility] with Persistent and worsening pelvic pain x 1 week with low grade fever. Patient was seen at an urgent care on 5.28.22 and prescribed bentyl with no improvement in pain. CT abd pelvis from 5.29.22 reads Suspected 7.9 cm pelvic  abscess with adjacent rectosigmoid colonic wall thickening, likely related to infectious/inflammatory colitis. No associated free air. Team is requesting abscess drain placement to the pelvic fluid collection. WBC 20.6. last recorded temperature 99.2. NKDA. General surgery consulted and recommends percutaneous drainage with outpatient follow up.   IR consulted for possible pelvic abscess drain. Case has been reviewed and procedure approved by Dr. Elby Showers.  Patient tentatively scheduled for 5.30.22 in CT with IV contrast.  Team instructed to: Keep Patient to be NPO after midnight  IR will call patient when ready.  Risks and benefits discussed with the patient including bleeding, infection, damage to adjacent structures, bowel perforation/fistula connection, and sepsis.  All of the patient's questions were answered, patient is agreeable to  proceed. Consent signed and in chart.     Thank you for this interesting consult.  I greatly enjoyed meeting [employer] and look forward to participating in their care.  A copy of this report was sent to the requesting provider on this date.  Electronically Signed: Alene Mires, NP 11/14/2020, 2:13 PM   I spent a total of 40 Minutes    in face to face in clinical consultation, greater than 50% of which was counseling/coordinating care for pelvic abscess drain.

## 2020-11-14 NOTE — Progress Notes (Signed)
Notified by EDP of need for admission d/t rectal abscess. [facility] accepts patient to med-surg bed at [facility]. EDP is to remain responsible for orders/medical decisions while patient is holding at [facility]. Upon arrival to [facility], [facility] will assume care. Nursing staff will call flow manager/carelink to notify them of patient's arrival so that the proper [facility] member may receive the patient. Nursing staff will notify the following consultants, general surgery (Dr. Luisa Hart), of patient's arrival for their evaluation. Thank you.

## 2020-11-14 NOTE — ED Provider Notes (Signed)
[facility]Y [facility] Provider Note: Justin Dell, MD, FACEP  CSN: [id number] MRN: [id number] ARRIVAL: 11/14/20 at 0318 ROOM: MH09/MH09   CHIEF COMPLAINT  Abdominal Pain   HISTORY OF PRESENT ILLNESS  11/14/20 3:40 AM Justin Meadows is a 45 y.o. male with 5 days of abdominal pain along with chills.  The pain is in his lower abdomen.  It was initially intermittent but is now constant.  He describes it as a burning pain with also a sharp component.  He rates it as a 10 out of 10.  It is not worse with movement or palpation.  He denies nausea or vomiting but has not wanted to eat due to the pain and believes he is dehydrated.  He had difficulty having a bowel movement and took a laxative which caused him to have diarrhea.  There was no blood in the diarrhea.  He was noted to have a low-grade fever of 100.1 on arrival.  He states his urine has been dark but he has had no dysuria.  He was seen at an urgent care 2 days ago and was prescribed dicyclomine which has not relieved his pain.   History reviewed. No pertinent past medical history.  History reviewed. No pertinent surgical history.  No family history on file.  Social History   Tobacco Use  . Smoking status: Current Every Day Smoker  . Smokeless tobacco: Never Used  Vaping Use  . Vaping Use: Never used    Prior to Admission medications   Medication Sig Start Date End Date Taking? Authorizing Provider  dicyclomine (BENTYL) 20 MG tablet Take 1 tablet (20 mg total) by mouth 4 (four) times daily -  before meals and at bedtime. 11/12/20   Wieters, Hallie C, PA-C    Allergies Patient has no known allergies.   REVIEW OF SYSTEMS  Negative except as noted here or in the History of Present Illness.   PHYSICAL EXAMINATION  Initial Vital Signs Blood pressure (!) 155/93, pulse 75, temperature 100.1 F (37.8 C), resp. rate 20, height 5\' 9"  (1.753 m), weight 74.8 kg, SpO2 98 %.  Examination General: Well-developed, well-nourished  male in no acute distress; appearance consistent with age of record HENT: normocephalic; atraumatic Eyes: pupils equal, round and reactive to light; extraocular muscles intact Neck: supple Heart: regular rate and rhythm Lungs: clear to auscultation bilaterally Abdomen: soft; nondistended; nontender; bowel sounds present Extremities: No deformity; full range of motion; pulses normal Neurologic: Awake, alert and oriented; motor function intact in all extremities and symmetric; no facial droop Skin: Warm and dry Psychiatric: Normal mood and affect   RESULTS  Summary of this visit's results, reviewed and interpreted by myself:   EKG Interpretation  Date/Time:    Ventricular Rate:    PR Interval:    QRS Duration:   QT Interval:    QTC Calculation:   R Axis:     Text Interpretation:        Laboratory Studies: Results for orders placed or performed during the hospital encounter of 11/14/20 (from the past 24 hour(s))  CBC with Differential/Platelet     Status: Abnormal   Collection Time: 11/14/20  4:10 AM  Result Value Ref Range   WBC 20.6 (H) 4.0 - 10.5 K/uL   RBC 4.35 4.22 - 5.81 MIL/uL   Hemoglobin 12.8 (L) 13.0 - 17.0 g/dL   HCT 11/16/20 (L) 97.0 - 26.3 %   MCV 87.8 80.0 - 100.0 fL   MCH 29.4 26.0 - 34.0 pg  MCHC 33.5 30.0 - 36.0 g/dL   RDW 96.7 59.1 - 63.8 %   Platelets 327 150 - 400 K/uL   nRBC 0.0 0.0 - 0.2 %   Neutrophils Relative % 83 %   Neutro Abs 17.1 (H) 1.7 - 7.7 K/uL   Lymphocytes Relative 5 %   Lymphs Abs 0.9 0.7 - 4.0 K/uL   Monocytes Relative 11 %   Monocytes Absolute 2.3 (H) 0.1 - 1.0 K/uL   Eosinophils Relative 0 %   Eosinophils Absolute 0.0 0.0 - 0.5 K/uL   Basophils Relative 0 %   Basophils Absolute 0.1 0.0 - 0.1 K/uL   Immature Granulocytes 1 %   Abs Immature Granulocytes 0.21 (H) 0.00 - 0.07 K/uL  Comprehensive metabolic panel     Status: Abnormal   Collection Time: 11/14/20  4:10 AM  Result Value Ref Range   Sodium 137 135 - 145 mmol/L    Potassium 2.7 (LL) 3.5 - 5.1 mmol/L   Chloride 100 98 - 111 mmol/L   CO2 26 22 - 32 mmol/L   Glucose, Bld 155 (H) 70 - 99 mg/dL   BUN 8 6 - 20 mg/dL   Creatinine, Ser 4.66 0.61 - 1.24 mg/dL   Calcium 8.5 (L) 8.9 - 10.3 mg/dL   Total Protein 7.8 6.5 - 8.1 g/dL   Albumin 3.4 (L) 3.5 - 5.0 g/dL   AST 17 15 - 41 U/L   ALT 27 0 - 44 U/L   Alkaline Phosphatase 65 38 - 126 U/L   Total Bilirubin 0.4 0.3 - 1.2 mg/dL   GFR, Estimated >59 >93 mL/min   Anion gap 11 5 - 15  Resp Panel by RT-PCR (Flu A&B, Covid)     Status: None   Collection Time: 11/14/20  6:18 AM   Specimen: Nasopharyngeal(NP) swabs in vial transport medium  Result Value Ref Range   SARS Coronavirus 2 by RT PCR NEGATIVE NEGATIVE   Influenza A by PCR NEGATIVE NEGATIVE   Influenza B by PCR NEGATIVE NEGATIVE  Urinalysis, Routine w reflex microscopic     Status: Abnormal   Collection Time: 11/14/20  6:30 AM  Result Value Ref Range   Color, Urine YELLOW YELLOW   APPearance CLEAR CLEAR   Specific Gravity, Urine <1.005 (L) 1.005 - 1.030   pH 6.0 5.0 - 8.0   Glucose, UA NEGATIVE NEGATIVE mg/dL   Hgb urine dipstick SMALL (A) NEGATIVE   Bilirubin Urine NEGATIVE NEGATIVE   Ketones, ur NEGATIVE NEGATIVE mg/dL   Protein, ur NEGATIVE NEGATIVE mg/dL   Nitrite NEGATIVE NEGATIVE   Leukocytes,Ua NEGATIVE NEGATIVE  Urinalysis, Microscopic (reflex)     Status: Abnormal   Collection Time: 11/14/20  6:30 AM  Result Value Ref Range   RBC / HPF 0-5 0 - 5 RBC/hpf   WBC, UA 0-5 0 - 5 WBC/hpf   Bacteria, UA RARE (A) NONE SEEN   Squamous Epithelial / LPF 0-5 0 - 5   Mucus PRESENT    Imaging Studies: CT ABDOMEN PELVIS W CONTRAST  Result Date: 11/14/2020 CLINICAL DATA:  Abdominal pain EXAM: CT ABDOMEN AND PELVIS WITH CONTRAST TECHNIQUE: Multidetector CT imaging of the abdomen and pelvis was performed using the standard protocol following bolus administration of intravenous contrast. CONTRAST:  OMNIPAQUE IOHEXOL 300 MG/ML  SOLN  COMPARISON:  None. FINDINGS: Lower chest: Lung bases are clear. Hepatobiliary: 2.0 cm subcapsular cyst in segment 4 (series 2/image 27). Gallbladder is unremarkable. No intrahepatic or extrahepatic ductal dilatation. Pancreas: Within normal limits.  Spleen: Within normal limits. Adrenals/Urinary Tract: Adrenal glands are within normal limits. Right renal cysts, measuring up to 2.2 cm in the lower pole (series 2/image 43). Left kidney is within normal limits. No hydronephrosis. Mildly thick-walled bladder, although underdistended. Stomach/Bowel: Stomach is within normal limits. No evidence of bowel obstruction. Normal appendix (series 2/image 63). Possible mild wall thickening involving the ascending colon (series 2/image 54), although equivocal. Transverse and proximal descending colon are normal. However, there is wall thickening involving the rectosigmoid colon with an associated 7.9 x 6.9 x 6.9 cm pelvic abscess (series 2/image 77). This distorts the adjacent loops of colon and likely communicates with one of the loops, although the direct communication is not well visualized the current CT. Vascular/Lymphatic: No evidence of abdominal aortic aneurysm. Atherosclerotic calcifications of the abdominal aorta and branch vessels. No suspicious abdominopelvic lymphadenopathy. Reproductive: Prostate is unremarkable. Other: Mild lower retroperitoneal/pelvic stranding. No free air. Musculoskeletal: Visualized osseous structures are within normal limits. IMPRESSION: Suspected 7.9 cm pelvic abscess with adjacent rectosigmoid colonic wall thickening, likely related to infectious/inflammatory colitis. No associated free air. These results were called by telephone at the time of interpretation on 11/14/2020 at 6:00 am to provider [facility] Meris Reede , who verbally acknowledged these results. Electronically Signed   By: Charline BillsSriyesh  Krishnan M.D.   On: 11/14/2020 06:20    ED COURSE and MDM  Nursing notes, initial and subsequent vitals  signs, including pulse oximetry, reviewed and interpreted by myself.  Vitals:   11/14/20 0400 11/14/20 0500 11/14/20 0612 11/14/20 0618  BP: (!) 126/94 129/77  138/89  Pulse: 80 74  73  Resp: 17 18  13   Temp:   99 F (37.2 C)   TempSrc:   Oral   SpO2: 95% 99%  98%  Weight:      Height:       Medications  potassium chloride 10 mEq in 100 mL IVPB (10 mEq Intravenous New Bag/Given 11/14/20 0616)  ceFEPIme (MAXIPIME) 2 g in sodium chloride 0.9 % 100 mL IVPB (0 g Intravenous Stopped 11/14/20 0701)    And  metroNIDAZOLE (FLAGYL) IVPB 500 mg (500 mg Intravenous New Bag/Given 11/14/20 0706)  0.9 %  sodium chloride infusion (1,000 mLs Intravenous New Bag/Given 11/14/20 0617)  fentaNYL (SUBLIMAZE) injection 100 mcg (100 mcg Intravenous Given 11/14/20 0621)  sodium chloride 0.9 % bolus 1,000 mL (0 mLs Intravenous Stopped 11/14/20 0510)  ondansetron (ZOFRAN) injection 4 mg (4 mg Intravenous Given 11/14/20 0407)  fentaNYL (SUBLIMAZE) injection 100 mcg (100 mcg Intravenous Given 11/14/20 0408)  iohexol (OMNIPAQUE) 300 MG/ML solution 100 mL (100 mLs Intravenous Contrast Given 11/14/20 0504)  potassium chloride SA (KLOR-CON) CR tablet 40 mEq (40 mEq Oral Given 11/14/20 0614)   6:10 AM Cefepime and Flagyl IV ordered for rectal inflammation with intra-abdominal abscess.  IV potassium ordered for hypokalemia.   PROCEDURES  Procedures CRITICAL CARE Performed by: Carlisle BeersJohn L Cierrah Dace Total critical care time: 30 minutes Critical care time was exclusive of separately billable procedures and treating other patients. Critical care was necessary to treat or prevent imminent or life-threatening deterioration. Critical care was time spent personally by me on the following activities: development of treatment plan with patient and/or surrogate as well as nursing, discussions with consultants, evaluation of patient's response to treatment, examination of patient, obtaining history from patient or surrogate, ordering and  performing treatments and interventions, ordering and review of laboratory studies, ordering and review of radiographic studies, pulse oximetry and re-evaluation of patient's condition.   ED DIAGNOSES  ICD-10-CM   1. Colitis, regional, with abscess (HCC)  K50.114   2. Hypokalemia  E87.6        Eder Macek, Jonny Ruiz, MD 11/14/20 [phone]

## 2020-11-14 NOTE — Plan of Care (Signed)
  Problem: Clinical Measurements: Goal: Diagnostic test results will improve Outcome: Progressing   Problem: Clinical Measurements: Goal: Respiratory complications will improve Outcome: Progressing   Problem: Clinical Measurements: Goal: Cardiovascular complication will be avoided Outcome: Progressing   Problem: Elimination: Goal: Will not experience complications related to bowel motility Outcome: Progressing   Problem: Safety: Goal: Ability to remain free from injury will improve Outcome: Progressing

## 2020-11-15 ENCOUNTER — Observation Stay (HOSPITAL_COMMUNITY): Payer: 59

## 2020-11-15 DIAGNOSIS — D509 Iron deficiency anemia, unspecified: Secondary | ICD-10-CM | POA: Diagnosis present

## 2020-11-15 DIAGNOSIS — Z20822 Contact with and (suspected) exposure to covid-19: Secondary | ICD-10-CM | POA: Diagnosis present

## 2020-11-15 DIAGNOSIS — R109 Unspecified abdominal pain: Secondary | ICD-10-CM | POA: Diagnosis not present

## 2020-11-15 DIAGNOSIS — K651 Peritoneal abscess: Secondary | ICD-10-CM | POA: Diagnosis present

## 2020-11-15 DIAGNOSIS — R739 Hyperglycemia, unspecified: Secondary | ICD-10-CM | POA: Diagnosis not present

## 2020-11-15 DIAGNOSIS — K5720 Diverticulitis of large intestine with perforation and abscess without bleeding: Secondary | ICD-10-CM | POA: Diagnosis present

## 2020-11-15 DIAGNOSIS — D539 Nutritional anemia, unspecified: Secondary | ICD-10-CM | POA: Diagnosis not present

## 2020-11-15 DIAGNOSIS — E876 Hypokalemia: Secondary | ICD-10-CM | POA: Diagnosis present

## 2020-11-15 DIAGNOSIS — R1030 Lower abdominal pain, unspecified: Secondary | ICD-10-CM | POA: Diagnosis present

## 2020-11-15 DIAGNOSIS — F17210 Nicotine dependence, cigarettes, uncomplicated: Secondary | ICD-10-CM | POA: Diagnosis present

## 2020-11-15 LAB — COMPREHENSIVE METABOLIC PANEL
ALT: 24 U/L (ref 0–44)
AST: 15 U/L (ref 15–41)
Albumin: 2.8 g/dL — ABNORMAL LOW (ref 3.5–5.0)
Alkaline Phosphatase: 89 U/L (ref 38–126)
Anion gap: 6 (ref 5–15)
BUN: 10 mg/dL (ref 6–20)
CO2: 27 mmol/L (ref 22–32)
Calcium: 8.6 mg/dL — ABNORMAL LOW (ref 8.9–10.3)
Chloride: 107 mmol/L (ref 98–111)
Creatinine, Ser: 1.05 mg/dL (ref 0.61–1.24)
GFR, Estimated: >60 mL/min (ref >60)
Glucose, Bld: 107 mg/dL — ABNORMAL HIGH (ref 70–99)
Potassium: 3.8 mmol/L (ref 3.5–5.1)
Sodium: 140 mmol/L (ref 135–145)
Total Bilirubin: 0.5 mg/dL (ref 0.3–1.2)
Total Protein: 6.9 g/dL (ref 6.5–8.1)

## 2020-11-15 LAB — CBC
HCT: 34.1 % — ABNORMAL LOW (ref 39.0–52.0)
Hemoglobin: 11.2 g/dL — ABNORMAL LOW (ref 13.0–17.0)
MCH: 29.4 pg (ref 26.0–34.0)
MCHC: 32.8 g/dL (ref 30.0–36.0)
MCV: 89.5 fL (ref 80.0–100.0)
Platelets: 296 10*3/uL (ref 150–400)
RBC: 3.81 MIL/uL — ABNORMAL LOW (ref 4.22–5.81)
RDW: 14.4 % (ref 11.5–15.5)
WBC: 17.1 10*3/uL — ABNORMAL HIGH (ref 4.0–10.5)
nRBC: 0 % (ref 0.0–0.2)

## 2020-11-15 MED ORDER — LIP MEDEX EX OINT
TOPICAL_OINTMENT | CUTANEOUS | Status: AC
  Filled 2020-11-15: qty 7

## 2020-11-15 MED ORDER — FENTANYL CITRATE (PF) 100 MCG/2ML IJ SOLN
INTRAMUSCULAR | Status: AC
  Filled 2020-11-15: qty 4

## 2020-11-15 MED ORDER — MIDAZOLAM HCL 2 MG/2ML IJ SOLN
INTRAMUSCULAR | Status: AC | PRN
  Administered 2020-11-15 (×2): 2 mg via INTRAVENOUS

## 2020-11-15 MED ORDER — SIMETHICONE 80 MG PO CHEW: 80.0000 mg | CHEWABLE_TABLET | Freq: Four times a day (QID) | ORAL | Status: DC | PRN

## 2020-11-15 MED ORDER — FENTANYL CITRATE (PF) 100 MCG/2ML IJ SOLN
INTRAMUSCULAR | Status: AC | PRN
  Administered 2020-11-15 (×2): 50 ug via INTRAVENOUS

## 2020-11-15 MED ORDER — TRAZODONE HCL 50 MG PO TABS
50.0000 mg | ORAL_TABLET | Freq: Every evening | ORAL | Status: DC | PRN
  Administered 2020-11-15: 50 mg via ORAL
  Filled 2020-11-15: qty 1

## 2020-11-15 MED ORDER — SIMETHICONE 80 MG PO CHEW
80.0000 mg | CHEWABLE_TABLET | Freq: Four times a day (QID) | ORAL | Status: DC | PRN
  Administered 2020-11-15: 80 mg via ORAL
  Filled 2020-11-15: qty 1

## 2020-11-15 MED ORDER — LIDOCAINE HCL (PF) 1 % IJ SOLN
INTRAMUSCULAR | Status: AC | PRN
  Administered 2020-11-15: 10 mL

## 2020-11-15 MED ORDER — DIPHENHYDRAMINE HCL 50 MG/ML IJ SOLN
INTRAMUSCULAR | Status: AC | PRN
  Administered 2020-11-15: 25 mg via INTRAVENOUS

## 2020-11-15 MED ORDER — DIPHENHYDRAMINE HCL 50 MG/ML IJ SOLN
INTRAMUSCULAR | Status: AC
  Filled 2020-11-15: qty 1

## 2020-11-15 MED ORDER — MIDAZOLAM HCL 2 MG/2ML IJ SOLN
INTRAMUSCULAR | Status: AC
  Filled 2020-11-15: qty 6

## 2020-11-15 NOTE — Progress Notes (Signed)
PROGRESS NOTE  Justin Meadows FBP:[id number] DOB: Oct 25, 1975 DOA: 11/14/2020 PCP: Pcp, No  Brief History   45 year old male no significant PMH presenting with abdominal pain.  CT showed pelvic abscess.  Seen by general surgery, thought more likely from diverticulitis.  Status post IR drain placement culture aspiration 5/30.  A & P  Pelvic abscess adjacent rectosigmoid colonic wall thickening.  Status post IR drain placement 5/30. --Much better, currently pain-free.  WBC slightly down.  Afebrile, hemodynamic stable. --Continue empiric antibiotics.    Disposition Plan:  Discussion:   Status is: Inpatient  Remains inpatient appropriate because:IV treatments appropriate due to intensity of illness or inability to take PO and Inpatient level of care appropriate due to severity of illness  Dispo: The patient is from: Home              Anticipated d/c is to: Home              Patient currently is not medically stable to d/c.   Difficult to place patient No  DVT prophylaxis: SCDs Start: 11/14/20 1413   Code Status: Full Code Level of care: Med-Surg Family Communication: none Brendia Sacks, MD  [facility] Direct contact: see [email or web address] (further directions at bottom of note if needed) 7PM-7AM contact night coverage as at bottom of note 11/15/2020, 2:03 PM  LOS: 0 days   Significant Hospital Events   . 5/29 admit pelvic abscess   Consults:  . General surgery . IR   Procedures:  . 5/30 CT guided placed of a 10 Fr drainage catheter placement into the lower pelvis via left transgluteal approach yielding 40 cc of purulent fluid  Significant Diagnostic Tests:  . CT pelvic abscess   Micro Data:  . 5/30 pelvic abscess culture >   Antimicrobials:  . Cefepime 5/29 > . Flagyl 5/29 >  Interval History/Subjective  CC: f/u abd pain  Feels much better, no pain, no n/v  Objective   Vitals:  Vitals:   11/15/20 1215 11/15/20 1245  BP: 138/86 (!) 144/92  Pulse: 80 69   Resp: (!) 22 18  Temp:  99 F (37.2 C)  SpO2: 100% 100%    Exam:  Constitutional:   . Appears calm and comfortable ENMT:  . grossly normal hearing  Respiratory:  . CTA bilaterally, no w/r/r.  . Respiratory effort normal.  Cardiovascular:  . RRR, no m/r/g Abdomen:  . Soft ntnd Psychiatric:  . Mental status o Mood, affect appropriate  I have personally reviewed the following:   Today's Data  . WBC modestly improved . CMP stable  Scheduled Meds: . diphenhydrAMINE      . fentaNYL      . midazolam       Continuous Infusions: . sodium chloride Stopped (11/14/20 1050)  . ceFEPime (MAXIPIME) IV 2 g (11/15/20 1309)  . metronidazole 500 mg (11/15/20 8242)    Principal Problem:   Pelvic abscess in male [facility]) Active Problems:   Abdominal pain   LOS: 0 days   How to contact the [facility] Attending or Consulting provider 7A - 7P or covering provider during after hours 7P -7A, for this patient?  1. Check the care team in [facility] and look for a) attending/consulting [facility] provider listed and b) the [facility] team listed 2. Log into [email or web address] and use [place]'s universal password to access. If you do not have the password, please contact the hospital operator. 3. Locate the [facility] provider you are looking for under [facility] and page to  a number that you can be directly reached. 4. If you still have difficulty reaching the provider, please page the [facility] (Director on Call) for the Hospitalists listed on amion for assistance.

## 2020-11-15 NOTE — Hospital Course (Addendum)
45 year old male no significant PMH presenting with abdominal pain.  CT showed pelvic abscess.  Seen by general surgery, thought more likely from diverticulitis.  Status post IR drain placement culture aspiration 5/30.  Improving, likely home in the next few days.  A & P  Pelvic abscess adjacent rectosigmoid colonic wall thickening.  Status post IR drain placement 5/30. -- Continues to improve.  Low-grade fever noted.  Continue drain, management per general surgery.  Continue empiric antibiotics.  Likely home in the next few days, per general surgery. --Check CBC in a.m.

## 2020-11-15 NOTE — Progress Notes (Signed)
Subjective/Chief Complaint: Gas pain Patient complains of gas pain.  He is passing a lot of gas.  He is bloated.  No nausea or vomiting.  He is scheduled for IR drain today.   Objective: Vital signs in last 24 hours: Temp:  [98.7 F (37.1 C)-100.3 F (37.9 C)] 98.7 F (37.1 C) (05/30 0835) Pulse Rate:  [73-86] 77 (05/30 0835) Resp:  [15-20] 15 (05/30 0603) BP: (118-148)/(74-86) 148/76 (05/30 0835) SpO2:  [96 %-99 %] 96 % (05/30 0835) Last BM Date: 11/14/20  Intake/Output from previous day: 05/29 0701 - 05/30 0700 In: 3545.6 [P.O.:1080; I.V.:972.6; IV Piggyback:1493] Out: 0  Intake/Output this shift: Total I/O In: 104.4 [IV Piggyback:104.4] Out: 0   General appearance: alert   Abdomen: Distended soft left lower quadrant tenderness without peritonitis  Cardiovascular: Normal sinus rhythm  Pulmonary: Lungs are clear  Lab Results:  Recent Labs    11/14/20 0410 11/15/20 0453  WBC 20.6* 17.1*  HGB 12.8* 11.2*  HCT 38.2* 34.1*  PLT 327 296   BMET Recent Labs    11/14/20 0410 11/15/20 0453  NA 137 140  K 2.7* 3.8  CL 100 107  CO2 26 27  GLUCOSE 155* 107*  BUN 8 10  CREATININE 0.86 1.05  CALCIUM 8.5* 8.6*   PT/INR No results for input(s): LABPROT, INR in the last 72 hours. ABG No results for input(s): PHART, HCO3 in the last 72 hours.  Invalid input(s): PCO2, PO2  Studies/Results: CT ABDOMEN PELVIS W CONTRAST  Result Date: 11/14/2020 CLINICAL DATA:  Abdominal pain EXAM: CT ABDOMEN AND PELVIS WITH CONTRAST TECHNIQUE: Multidetector CT imaging of the abdomen and pelvis was performed using the standard protocol following bolus administration of intravenous contrast. CONTRAST:  OMNIPAQUE IOHEXOL 300 MG/ML  SOLN COMPARISON:  None. FINDINGS: Lower chest: Lung bases are clear. Hepatobiliary: 2.0 cm subcapsular cyst in segment 4 (series 2/image 27). Gallbladder is unremarkable. No intrahepatic or extrahepatic ductal dilatation. Pancreas: Within normal  limits. Spleen: Within normal limits. Adrenals/Urinary Tract: Adrenal glands are within normal limits. Right renal cysts, measuring up to 2.2 cm in the lower pole (series 2/image 43). Left kidney is within normal limits. No hydronephrosis. Mildly thick-walled bladder, although underdistended. Stomach/Bowel: Stomach is within normal limits. No evidence of bowel obstruction. Normal appendix (series 2/image 63). Possible mild wall thickening involving the ascending colon (series 2/image 54), although equivocal. Transverse and proximal descending colon are normal. However, there is wall thickening involving the rectosigmoid colon with an associated 7.9 x 6.9 x 6.9 cm pelvic abscess (series 2/image 77). This distorts the adjacent loops of colon and likely communicates with one of the loops, although the direct communication is not well visualized the current CT. Vascular/Lymphatic: No evidence of abdominal aortic aneurysm. Atherosclerotic calcifications of the abdominal aorta and branch vessels. No suspicious abdominopelvic lymphadenopathy. Reproductive: Prostate is unremarkable. Other: Mild lower retroperitoneal/pelvic stranding. No free air. Musculoskeletal: Visualized osseous structures are within normal limits. IMPRESSION: Suspected 7.9 cm pelvic abscess with adjacent rectosigmoid colonic wall thickening, likely related to infectious/inflammatory colitis. No associated free air. These results were called by telephone at the time of interpretation on 11/14/2020 at 6:00 am to provider [facility] , who verbally acknowledged these results. Electronically Signed   By: Charline Bills M.D.   On: 11/14/2020 06:20    Anti-infectives: Anti-infectives (From admission, onward)   Start     Dose/Rate Route Frequency Ordered Stop   11/14/20 1600  metroNIDAZOLE (FLAGYL) IVPB 500 mg  500 mg 100 mL/hr over 60 Minutes Intravenous Every 8 hours 11/14/20 1407     11/14/20 1500  ceFEPIme (MAXIPIME) 2 g in sodium chloride  0.9 % 100 mL IVPB        2 g 200 mL/hr over 30 Minutes Intravenous Every 8 hours 11/14/20 1414     11/14/20 0615  ceFEPIme (MAXIPIME) 2 g in sodium chloride 0.9 % 100 mL IVPB       "And" Linked Group Details   2 g 200 mL/hr over 30 Minutes Intravenous  Once 11/14/20 0610 11/14/20 0701   11/14/20 0615  metroNIDAZOLE (FLAGYL) IVPB 500 mg       "And" Linked Group Details   500 mg 100 mL/hr over 60 Minutes Intravenous  Once 11/14/20 0610 11/14/20 5859      Assessment/Plan: Patient Active Problem List   Diagnosis Date Noted  . Abdominal pain 11/14/2020    Pelvic abscess more likely from diverticulitis-scheduled for IR drain placement  Add simethicone for gas  Can resume a regular diet after the procedure  Continue IV antibiotics  Primary service to follow tomorrow   LOS: 0 days    Dortha Schwalbe MD 11/15/2020

## 2020-11-15 NOTE — Procedures (Signed)
Pre procedural Dx: Pelvic absccess Post procedural Dx: Same  Technically successful CT guided placed of a 10 Fr drainage catheter placement into the lower pelvis via left transgluteal approach yielding 40 cc of purulent fluid.    A representative aspirated sample was capped and sent to the laboratory for analysis.    EBL: Trace Complications: None immediate  Katherina Right, MD Pager #: [phone]

## 2020-11-16 DIAGNOSIS — K651 Peritoneal abscess: Secondary | ICD-10-CM | POA: Diagnosis not present

## 2020-11-16 LAB — GASTROINTESTINAL PANEL BY PCR, STOOL (REPLACES STOOL CULTURE)

## 2020-11-16 LAB — CBC
HCT: 36.7 % — ABNORMAL LOW (ref 39.0–52.0)
Hemoglobin: 12.1 g/dL — ABNORMAL LOW (ref 13.0–17.0)
MCH: 29.7 pg (ref 26.0–34.0)
MCHC: 33 g/dL (ref 30.0–36.0)
MCV: 90 fL (ref 80.0–100.0)
Platelets: 344 10*3/uL (ref 150–400)
RBC: 4.08 MIL/uL — ABNORMAL LOW (ref 4.22–5.81)
RDW: 14.6 % (ref 11.5–15.5)
WBC: 14.6 10*3/uL — ABNORMAL HIGH (ref 4.0–10.5)
nRBC: 0 % (ref 0.0–0.2)

## 2020-11-16 MED ORDER — SODIUM CHLORIDE 0.9% FLUSH
5.0000 mL | Freq: Three times a day (TID) | INTRAVENOUS | Status: DC
  Administered 2020-11-16 – 2020-11-18 (×6): 5 mL

## 2020-11-16 NOTE — Progress Notes (Signed)
PROGRESS NOTE  Justin Meadows HDQ:[id number] DOB: 01/01/1976 DOA: 11/14/2020 PCP: Pcp, No  Brief History   45 year old male no significant PMH presenting with abdominal pain.  CT showed pelvic abscess.  Seen by general surgery, thought more likely from diverticulitis.  Status post IR drain placement culture aspiration 5/30.  Improving, likely home in the next few days.  A & P  Pelvic abscess adjacent rectosigmoid colonic wall thickening.  Status post IR drain placement 5/30. -- Continues to improve.  Low-grade fever noted.  Continue drain, management per general surgery.  Continue empiric antibiotics.  Likely home in the next few days, per general surgery. --Check CBC in a.m.    Disposition Plan:  Discussion:   Status is: Inpatient  Remains inpatient appropriate because:IV treatments appropriate due to intensity of illness or inability to take PO and Inpatient level of care appropriate due to severity of illness  Dispo: The patient is from: Home              Anticipated d/c is to: Home              Patient currently is not medically stable to d/c.   Difficult to place patient No  DVT prophylaxis: SCDs Start: 11/14/20 1413   Code Status: Full Code Level of care: Med-Surg Family Communication: none Brendia Sacks, MD  [facility] Direct contact: see [email or web address] (further directions at bottom of note if needed) 7PM-7AM contact night coverage as at bottom of note 11/16/2020, 1:48 PM  LOS: 1 day   Significant Hospital Events   . 5/29 admit pelvic abscess   Consults:  . General surgery . IR   Procedures:  . 5/30 CT guided placed of a 10 Fr drainage catheter placement into the lower pelvis via left transgluteal approach yielding 40 cc of purulent fluid  Significant Diagnostic Tests:  . CT pelvic abscess   Micro Data:  . 5/30 pelvic abscess culture >   Antimicrobials:  . Cefepime 5/29 > . Flagyl 5/29 >  Interval History/Subjective  CC: f/u abd pain  Feels fine, no  complaints, tolerating drain.  Objective   Vitals:  Vitals:   11/16/20 1317 11/16/20 1321  BP: (!) 159/95   Pulse: 71   Resp: 18   Temp:  99.3 F (37.4 C)  SpO2: 96%     Exam:  Constitutional:   . Appears calm and comfortable Respiratory:  . CTA bilaterally, no w/r/r.  . Respiratory effort normal. No retractions or accessory muscle use Cardiovascular:  . RRR, no m/r/g . No LE extremity edema   Psychiatric:  . Mental status o Mood, affect appropriate  I have personally reviewed the following:   Today's Data  .   Scheduled Meds: . sodium chloride flush  5 mL Intracatheter Q8H   Continuous Infusions: . sodium chloride Stopped (11/14/20 1050)  . ceFEPime (MAXIPIME) IV 2 g (11/16/20 1316)  . metronidazole 500 mg (11/16/20 1194)    Principal Problem:   Pelvic abscess in male [facility]) Active Problems:   Abdominal pain   LOS: 1 day   How to contact the [facility] Attending or Consulting provider 7A - 7P or covering provider during after hours 7P -7A, for this patient?  1. Check the care team in [facility] and look for a) attending/consulting [facility] provider listed and b) the [facility] team listed 2. Log into [email or web address] and use [place]'s universal password to access. If you do not have the password, please contact the hospital operator. 3. Locate the [facility] provider  you are looking for under [facility] and page to a number that you can be directly reached. 4. If you still have difficulty reaching the provider, please page the [facility] (Director on Call) for the Hospitalists listed on amion for assistance.

## 2020-11-16 NOTE — Progress Notes (Signed)
Referring Physician(s): Cornett,T  Supervising Physician: Ruel Favors  Patient Status:  [facility] - In-pt  Chief Complaint: Pelvic pain/abscess   Subjective: Pt doing ok today; has some discomfort at left transgluteal drain site as expected; denies nausea or vomiting; passing gas and small amt stool   Allergies: Patient has no known allergies.  Medications: Prior to Admission medications   Medication Sig Start Date End Date Taking? Authorizing Provider  dicyclomine (BENTYL) 20 MG tablet Take 1 tablet (20 mg total) by mouth 4 (four) times daily -  before meals and at bedtime. 11/12/20  Yes Wieters, Hallie C, PA-C  ibuprofen (ADVIL) 200 MG tablet Take 400-600 mg by mouth every 6 (six) hours as needed for fever, headache or mild pain.   Yes [provider]     Vital Signs: BP (!) 165/81 (BP Location: Left Arm)   Pulse 68   Temp 99 F (37.2 C) (Oral)   Resp 16   Ht 5\' 9"  (1.753 m)   Wt 165 lb (74.8 kg)   SpO2 99%   BMI 24.37 kg/m   Physical Exam awake, alert.  Left transgluteal drain intact, insertion site mild to moderately tender, output 70 cc yesterday, 50 cc today feculent appearing fluid, air in bag  Imaging: CT ABDOMEN PELVIS W CONTRAST  Result Date: 11/14/2020 CLINICAL DATA:  Abdominal pain EXAM: CT ABDOMEN AND PELVIS WITH CONTRAST TECHNIQUE: Multidetector CT imaging of the abdomen and pelvis was performed using the standard protocol following bolus administration of intravenous contrast. CONTRAST:  11/16/2020 OMNIPAQUE IOHEXOL 300 MG/ML  SOLN COMPARISON:  None. FINDINGS: Lower chest: Lung bases are clear. Hepatobiliary: 2.0 cm subcapsular cyst in segment 4 (series 2/image 27). Gallbladder is unremarkable. No intrahepatic or extrahepatic ductal dilatation. Pancreas: Within normal limits. Spleen: Within normal limits. Adrenals/Urinary Tract: Adrenal glands are within normal limits. Right renal cysts, measuring up to 2.2 cm in the lower pole (series 2/image 43). Left  kidney is within normal limits. No hydronephrosis. Mildly thick-walled bladder, although underdistended. Stomach/Bowel: Stomach is within normal limits. No evidence of bowel obstruction. Normal appendix (series 2/image 63). Possible mild wall thickening involving the ascending colon (series 2/image 54), although equivocal. Transverse and proximal descending colon are normal. However, there is wall thickening involving the rectosigmoid colon with an associated 7.9 x 6.9 x 6.9 cm pelvic abscess (series 2/image 77). This distorts the adjacent loops of colon and likely communicates with one of the loops, although the direct communication is not well visualized the current CT. Vascular/Lymphatic: No evidence of abdominal aortic aneurysm. Atherosclerotic calcifications of the abdominal aorta and branch vessels. No suspicious abdominopelvic lymphadenopathy. Reproductive: Prostate is unremarkable. Other: Mild lower retroperitoneal/pelvic stranding. No free air. Musculoskeletal: Visualized osseous structures are within normal limits. IMPRESSION: Suspected 7.9 cm pelvic abscess with adjacent rectosigmoid colonic wall thickening, likely related to infectious/inflammatory colitis. No associated free air. These results were called by telephone at the time of interpretation on 11/14/2020 at 6:00 am to provider [facility] , who verbally acknowledged these results. Electronically Signed   By: [facility] M.D.   On: 11/14/2020 06:20   CT IMAGE GUIDED FLUID DRAIN BY CATHETER  Result Date: 11/16/2020 INDICATION: Pericolonic abscess. Please perform CT-guided percutaneous drainage catheter placement for infection source control purposes. EXAM: CT IMAGE GUIDED FLUID DRAIN BY CATHETER COMPARISON:  CT abdomen and pelvis-11/14/2020 MEDICATIONS: The patient is currently admitted to the hospital and receiving intravenous antibiotics. The antibiotics were administered within an appropriate time frame prior to the initiation of  the  procedure. ANESTHESIA/SEDATION: Moderate (conscious) sedation was employed during this procedure. A total of Versed 4 mg, Benadryl 25 mg and Fentanyl 100 mcg was administered intravenously. Moderate Sedation Time: 19 minutes. The patient's level of consciousness and vital signs were monitored continuously by radiology nursing throughout the procedure under my direct supervision. CONTRAST:  None COMPLICATIONS: None immediate. PROCEDURE: Informed written consent was obtained from the patient after a discussion of the risks, benefits and alternatives to treatment. The patient was placed prone on the CT gantry and a pre procedural CT was performed demonstrating slight decrease in size of the known abscess/fluid collection within the midline of the pelvis with dominant component measuring approximately 5.0 x 3.4 cm (image 20, series 2), previously, 7.9 x 6.9 cm. The procedure was planned. A timeout was performed prior to the initiation of the procedure. The skin overlying the left buttocks was prepped and draped in the usual sterile fashion. The overlying soft tissues were anesthetized with 1% lidocaine with epinephrine. Appropriate trajectory was planned with the use of a 22 gauge spinal needle. An 18 gauge trocar needle was advanced into the abscess/fluid collection and a short Amplatz super stiff wire was coiled within the collection. Appropriate positioning was confirmed with a limited CT scan. The tract was serially dilated allowing placement of a 10 [place] all-purpose drainage catheter. Appropriate positioning was confirmed with a limited postprocedural CT scan. Approximately 40 ml of purulent fluid was aspirated. The tube was connected to a drainage bag and sutured in place. A dressing was placed. The patient tolerated the procedure well without immediate post procedural complication. IMPRESSION: Successful CT guided placement of a 10 French all purpose drain catheter into the pelvic abscess with aspiration of 40  mL of purulent fluid. Samples were sent to the laboratory as requested by the ordering clinical team. Electronically Signed   By: Simonne Come M.D.   On: 11/16/2020 08:05    Labs:  CBC: Recent Labs    11/14/20 0410 11/15/20 0453  WBC 20.6* 17.1*  HGB 12.8* 11.2*  HCT 38.2* 34.1*  PLT 327 296    COAGS: No results for input(s): INR, APTT in the last 8760 hours.  BMP: Recent Labs    11/14/20 0410 11/15/20 0453  NA 137 140  K 2.7* 3.8  CL 100 107  CO2 26 27  GLUCOSE 155* 107*  BUN 8 10  CALCIUM 8.5* 8.6*  CREATININE 0.86 1.05  GFRNONAA >60 >60    LIVER FUNCTION TESTS: Recent Labs    11/14/20 0410 11/15/20 0453  BILITOT 0.4 0.5  AST 17 15  ALT 27 24  ALKPHOS 65 89  PROT 7.8 6.9  ALBUMIN 3.4* 2.8*    Assessment and Plan: Patient with history of abdominal/pelvic pain with approximately 8 cm pelvic abscess with adjacent rectosigmoid colonic wall thickening; status post drain placement on 11/15/2020; temp 99, drain fluid cultures pending; WBC 17.1 down from 20.6, hemoglobin 11.2; continue with drain irrigation/output monitoring/lab checks; obtain follow-up CT once output minimal; will likely need drain injection before removal as well.   Electronically Signed: D. Jeananne Rama, PA-C 11/16/2020, 11:39 AM   I spent a total of 15 minutes at the the patient's bedside AND on the patient's hospital floor or unit, greater than 50% of which was counseling/coordinating care for pelvic abscess drain    Patient ID: Justin Meadows, male   DOB: Jun 06, 1976, 45 y.o.   MRN: [id number]

## 2020-11-16 NOTE — Progress Notes (Signed)
Progress Note     Subjective: CC: febrile to 100.6 yesterday pm, hypertensive. Some soreness following his drain placement. Tolerating diet without nausea/emesis. Passing flatus. No abdominal pain. Ambulating in room.   Objective: Vital signs in last 24 hours: Temp:  [98.3 F (36.8 C)-100.6 F (38.1 C)] 99 F (37.2 C) (05/31 0531) Pulse Rate:  [61-85] 68 (05/31 0531) Resp:  [15-22] 16 (05/31 0531) BP: (135-165)/(77-92) 165/81 (05/31 0531) SpO2:  [94 %-100 %] 99 % (05/31 0531) Last BM Date: 11/15/20  Intake/Output from previous day: 05/30 0701 - 05/31 0700 In: 1487.5 [P.O.:840; IV Piggyback:647.5] Out: 71 [Urine:1; Drains:70] Intake/Output this shift: Total I/O In: 325.6 [P.O.:240; IV Piggyback:85.6] Out: 50 [Drains:50]  PE: General: pleasant, WD, male who is laying in bed in NAD HEENT: head is normocephalic, atraumatic.  Sclera are noninjected. Mouth is pink and moist Heart: regular, rate, and rhythm. Palpable radial pulses bilaterally Lungs: CTAB, no wheezes, rhonchi, or rales noted.  Respiratory effort nonlabored Abd: soft, NT, ND, +BS. Drain with scant brown output MS: all 4 extremities are symmetrical with no cyanosis, clubbing, or edema. Skin: warm and dry with no masses, lesions, or rashes Psych: A&Ox3 with an appropriate affect.    Lab Results:  Recent Labs    11/14/20 0410 11/15/20 0453  WBC 20.6* 17.1*  HGB 12.8* 11.2*  HCT 38.2* 34.1*  PLT 327 296   BMET Recent Labs    11/14/20 0410 11/15/20 0453  NA 137 140  K 2.7* 3.8  CL 100 107  CO2 26 27  GLUCOSE 155* 107*  BUN 8 10  CREATININE 0.86 1.05  CALCIUM 8.5* 8.6*   PT/INR No results for input(s): LABPROT, INR in the last 72 hours. CMP     Component Value Date/Time   NA 140 11/15/2020 0453   K 3.8 11/15/2020 0453   CL 107 11/15/2020 0453   CO2 27 11/15/2020 0453   GLUCOSE 107 (H) 11/15/2020 0453   BUN 10 11/15/2020 0453   CREATININE 1.05 11/15/2020 0453   CALCIUM 8.6 (L) 11/15/2020  0453   PROT 6.9 11/15/2020 0453   ALBUMIN 2.8 (L) 11/15/2020 0453   AST 15 11/15/2020 0453   ALT 24 11/15/2020 0453   ALKPHOS 89 11/15/2020 0453   BILITOT 0.5 11/15/2020 0453   GFRNONAA >60 11/15/2020 0453   Lipase  No results found for: LIPASE     Studies/Results: CT IMAGE GUIDED FLUID DRAIN BY CATHETER  Result Date: 11/16/2020 INDICATION: Pericolonic abscess. Please perform CT-guided percutaneous drainage catheter placement for infection source control purposes. EXAM: CT IMAGE GUIDED FLUID DRAIN BY CATHETER COMPARISON:  CT abdomen and pelvis-11/14/2020 MEDICATIONS: The patient is currently admitted to the hospital and receiving intravenous antibiotics. The antibiotics were administered within an appropriate time frame prior to the initiation of the procedure. ANESTHESIA/SEDATION: Moderate (conscious) sedation was employed during this procedure. A total of Versed 4 mg, Benadryl 25 mg and Fentanyl 100 mcg was administered intravenously. Moderate Sedation Time: 19 minutes. The patient's level of consciousness and vital signs were monitored continuously by radiology nursing throughout the procedure under my direct supervision. CONTRAST:  None COMPLICATIONS: None immediate. PROCEDURE: Informed written consent was obtained from the patient after a discussion of the risks, benefits and alternatives to treatment. The patient was placed prone on the CT gantry and a pre procedural CT was performed demonstrating slight decrease in size of the known abscess/fluid collection within the midline of the pelvis with dominant component measuring approximately 5.0 x 3.4 cm (image 20,  series 2), previously, 7.9 x 6.9 cm. The procedure was planned. A timeout was performed prior to the initiation of the procedure. The skin overlying the left buttocks was prepped and draped in the usual sterile fashion. The overlying soft tissues were anesthetized with 1% lidocaine with epinephrine. Appropriate trajectory was planned  with the use of a 22 gauge spinal needle. An 18 gauge trocar needle was advanced into the abscess/fluid collection and a short Amplatz super stiff wire was coiled within the collection. Appropriate positioning was confirmed with a limited CT scan. The tract was serially dilated allowing placement of a 10 [place] all-purpose drainage catheter. Appropriate positioning was confirmed with a limited postprocedural CT scan. Approximately 40 ml of purulent fluid was aspirated. The tube was connected to a drainage bag and sutured in place. A dressing was placed. The patient tolerated the procedure well without immediate post procedural complication. IMPRESSION: Successful CT guided placement of a 10 French all purpose drain catheter into the pelvic abscess with aspiration of 40 mL of purulent fluid. Samples were sent to the laboratory as requested by the ordering clinical team. Electronically Signed   By: Simonne Come M.D.   On: 11/16/2020 08:05    Anti-infectives: Anti-infectives (From admission, onward)   Start     Dose/Rate Route Frequency Ordered Stop   11/14/20 1600  metroNIDAZOLE (FLAGYL) IVPB 500 mg        500 mg 100 mL/hr over 60 Minutes Intravenous Every 8 hours 11/14/20 1407     11/14/20 1500  ceFEPIme (MAXIPIME) 2 g in sodium chloride 0.9 % 100 mL IVPB        2 g 200 mL/hr over 30 Minutes Intravenous Every 8 hours 11/14/20 1414     11/14/20 0615  ceFEPIme (MAXIPIME) 2 g in sodium chloride 0.9 % 100 mL IVPB       "And" Linked Group Details   2 g 200 mL/hr over 30 Minutes Intravenous  Once 11/14/20 0610 11/14/20 0701   11/14/20 0615  metroNIDAZOLE (FLAGYL) IVPB 500 mg       "And" Linked Group Details   500 mg 100 mL/hr over 60 Minutes Intravenous  Once 11/14/20 0610 11/14/20 2725       Assessment/Plan Pelvic abscess - likely diverticular origin - s/p IR drain placement 5/30  - follow abscess cultures: gram +cocci, gram - rods - CBC today pending - tolerating soft diet - continue for  now  FEN: soft  ID: cefepime 5/28>> flagyl 5/29>> VTE: SCDs  Disposition: follow culture. Follow fever and WBC - if improving can transition to PO abx in the next day or so. Will need follow up with GI in the next 6-8 weeks for colonoscopy which I discussed with him   LOS: 1 day    Eric Form, [facility] 11/16/2020, 12:03 PM Please see Amion for pager number during day hours 7:00am-4:30pm

## 2020-11-17 DIAGNOSIS — K651 Peritoneal abscess: Secondary | ICD-10-CM | POA: Diagnosis not present

## 2020-11-17 DIAGNOSIS — D539 Nutritional anemia, unspecified: Secondary | ICD-10-CM

## 2020-11-17 DIAGNOSIS — R739 Hyperglycemia, unspecified: Secondary | ICD-10-CM

## 2020-11-17 DIAGNOSIS — R109 Unspecified abdominal pain: Secondary | ICD-10-CM | POA: Diagnosis not present

## 2020-11-17 LAB — CBC
HCT: 34.1 % — ABNORMAL LOW (ref 39.0–52.0)
Hemoglobin: 11.2 g/dL — ABNORMAL LOW (ref 13.0–17.0)
MCH: 29.2 pg (ref 26.0–34.0)
MCHC: 32.8 g/dL (ref 30.0–36.0)
MCV: 88.8 fL (ref 80.0–100.0)
Platelets: 338 10*3/uL (ref 150–400)
RBC: 3.84 MIL/uL — ABNORMAL LOW (ref 4.22–5.81)
RDW: 14.3 % (ref 11.5–15.5)
WBC: 11.9 10*3/uL — ABNORMAL HIGH (ref 4.0–10.5)
nRBC: 0 % (ref 0.0–0.2)

## 2020-11-17 LAB — COMPREHENSIVE METABOLIC PANEL
ALT: 25 U/L (ref 0–44)
AST: 19 U/L (ref 15–41)
Albumin: 3 g/dL — ABNORMAL LOW (ref 3.5–5.0)
Alkaline Phosphatase: 56 U/L (ref 38–126)
Anion gap: 9 (ref 5–15)
BUN: 10 mg/dL (ref 6–20)
CO2: 29 mmol/L (ref 22–32)
Calcium: 8.9 mg/dL (ref 8.9–10.3)
Chloride: 102 mmol/L (ref 98–111)
Creatinine, Ser: 1.04 mg/dL (ref 0.61–1.24)
GFR, Estimated: >60 mL/min (ref >60)
Glucose, Bld: 100 mg/dL — ABNORMAL HIGH (ref 70–99)
Potassium: 3.4 mmol/L — ABNORMAL LOW (ref 3.5–5.1)
Sodium: 140 mmol/L (ref 135–145)
Total Bilirubin: 0.1 mg/dL — ABNORMAL LOW (ref 0.3–1.2)
Total Protein: 7.2 g/dL (ref 6.5–8.1)

## 2020-11-17 LAB — MAGNESIUM: Magnesium: 2 mg/dL (ref 1.7–2.4)

## 2020-11-17 LAB — PHOSPHORUS: Phosphorus: 3.5 mg/dL (ref 2.5–4.6)

## 2020-11-17 MED ORDER — SACCHAROMYCES BOULARDII 250 MG PO CAPS
250.0000 mg | ORAL_CAPSULE | Freq: Two times a day (BID) | ORAL | Status: DC
  Administered 2020-11-17 – 2020-11-18 (×3): 250 mg via ORAL
  Filled 2020-11-17 (×3): qty 1

## 2020-11-17 MED ORDER — FENTANYL CITRATE (PF) 100 MCG/2ML IJ SOLN
100.0000 ug | INTRAMUSCULAR | Status: DC | PRN
Start: 2020-11-17 — End: 2020-11-18

## 2020-11-17 MED ORDER — CIPROFLOXACIN HCL 500 MG PO TABS
500.0000 mg | ORAL_TABLET | Freq: Two times a day (BID) | ORAL | Status: DC
  Administered 2020-11-17 – 2020-11-18 (×2): 500 mg via ORAL
  Filled 2020-11-17 (×3): qty 1

## 2020-11-17 MED ORDER — METRONIDAZOLE 500 MG PO TABS
500.0000 mg | ORAL_TABLET | Freq: Three times a day (TID) | ORAL | Status: DC
  Administered 2020-11-17 – 2020-11-18 (×4): 500 mg via ORAL
  Filled 2020-11-17 (×4): qty 1

## 2020-11-17 MED ORDER — POTASSIUM CHLORIDE CRYS ER 20 MEQ PO TBCR
40.0000 meq | EXTENDED_RELEASE_TABLET | Freq: Two times a day (BID) | ORAL | Status: AC
  Administered 2020-11-17 – 2020-11-18 (×2): 40 meq via ORAL
  Filled 2020-11-17 (×2): qty 2

## 2020-11-17 NOTE — Progress Notes (Signed)
Progress Note     Subjective: CC: afebrile, hypertensive. Feeling well this am. He reports feeling mildly bloated, some belching, and low appetite but tolerating soft diet without nausea/emesis. He is passing flatus and has had small BMs without pain but some straining. He has been ambulatory in room. No respiratory complaints  Objective: Vital signs in last 24 hours: Temp:  [98.7 F (37.1 C)-99.5 F (37.5 C)] 98.7 F (37.1 C) (06/01 0433) Pulse Rate:  [61-71] 61 (06/01 0433) Resp:  [16-18] 16 (06/01 0433) BP: (152-160)/(70-95) 160/70 (06/01 0433) SpO2:  [96 %-99 %] 99 % (06/01 0433) Last BM Date: 11/16/20  Intake/Output from previous day: 05/31 0701 - 06/01 0700 In: 1259.8 [P.O.:680; I.V.:10; IV Piggyback:569.8] Out: 75 [Drains:75] Intake/Output this shift: No intake/output data recorded.  PE: General: pleasant, WD, male who is laying in bed in NAD HEENT: head is normocephalic, atraumatic.  Sclera are noninjected. Mouth is pink and moist Heart: regular, rate, and rhythm. Palpable radial pulses bilaterally Lungs: CTAB, no wheezes, rhonchi, or rales noted.  Respiratory effort nonlabored Abd: soft, NT, ND, +BS. Drain with brown output MS: all 4 extremities are symmetrical with no cyanosis, clubbing, or edema. Skin: warm and dry with no masses, lesions, or rashes Psych: A&Ox3 with an appropriate affect.    Lab Results:  Recent Labs    11/16/20 1219 11/17/20 0426  WBC 14.6* 11.9*  HGB 12.1* 11.2*  HCT 36.7* 34.1*  PLT 344 338   BMET Recent Labs    11/15/20 0453 11/17/20 0834  NA 140 140  K 3.8 3.4*  CL 107 102  CO2 27 29  GLUCOSE 107* 100*  BUN 10 10  CREATININE 1.05 1.04  CALCIUM 8.6* 8.9   PT/INR No results for input(s): LABPROT, INR in the last 72 hours. CMP     Component Value Date/Time   NA 140 11/17/2020 0834   K 3.4 (L) 11/17/2020 0834   CL 102 11/17/2020 0834   CO2 29 11/17/2020 0834   GLUCOSE 100 (H) 11/17/2020 0834   BUN 10 11/17/2020  0834   CREATININE 1.04 11/17/2020 0834   CALCIUM 8.9 11/17/2020 0834   PROT 7.2 11/17/2020 0834   ALBUMIN 3.0 (L) 11/17/2020 0834   AST 19 11/17/2020 0834   ALT 25 11/17/2020 0834   ALKPHOS 56 11/17/2020 0834   BILITOT 0.1 (L) 11/17/2020 0834   GFRNONAA >60 11/17/2020 0834   Lipase  No results found for: LIPASE     Studies/Results: CT IMAGE GUIDED FLUID DRAIN BY CATHETER  Result Date: 11/16/2020 INDICATION: Pericolonic abscess. Please perform CT-guided percutaneous drainage catheter placement for infection source control purposes. EXAM: CT IMAGE GUIDED FLUID DRAIN BY CATHETER COMPARISON:  CT abdomen and pelvis-11/14/2020 MEDICATIONS: The patient is currently admitted to the hospital and receiving intravenous antibiotics. The antibiotics were administered within an appropriate time frame prior to the initiation of the procedure. ANESTHESIA/SEDATION: Moderate (conscious) sedation was employed during this procedure. A total of Versed 4 mg, Benadryl 25 mg and Fentanyl 100 mcg was administered intravenously. Moderate Sedation Time: 19 minutes. The patient's level of consciousness and vital signs were monitored continuously by radiology nursing throughout the procedure under my direct supervision. CONTRAST:  None COMPLICATIONS: None immediate. PROCEDURE: Informed written consent was obtained from the patient after a discussion of the risks, benefits and alternatives to treatment. The patient was placed prone on the CT gantry and a pre procedural CT was performed demonstrating slight decrease in size of the known abscess/fluid collection within the midline  of the pelvis with dominant component measuring approximately 5.0 x 3.4 cm (image 20, series 2), previously, 7.9 x 6.9 cm. The procedure was planned. A timeout was performed prior to the initiation of the procedure. The skin overlying the left buttocks was prepped and draped in the usual sterile fashion. The overlying soft tissues were anesthetized  with 1% lidocaine with epinephrine. Appropriate trajectory was planned with the use of a 22 gauge spinal needle. An 18 gauge trocar needle was advanced into the abscess/fluid collection and a short Amplatz super stiff wire was coiled within the collection. Appropriate positioning was confirmed with a limited CT scan. The tract was serially dilated allowing placement of a 10 [place] all-purpose drainage catheter. Appropriate positioning was confirmed with a limited postprocedural CT scan. Approximately 40 ml of purulent fluid was aspirated. The tube was connected to a drainage bag and sutured in place. A dressing was placed. The patient tolerated the procedure well without immediate post procedural complication. IMPRESSION: Successful CT guided placement of a 10 French all purpose drain catheter into the pelvic abscess with aspiration of 40 mL of purulent fluid. Samples were sent to the laboratory as requested by the ordering clinical team. Electronically Signed   By: Simonne Come M.D.   On: 11/16/2020 08:05    Anti-infectives: Anti-infectives (From admission, onward)   Start     Dose/Rate Route Frequency Ordered Stop   11/17/20 1600  metroNIDAZOLE (FLAGYL) tablet 500 mg        500 mg Oral Every 8 hours 11/17/20 0903     11/17/20 1400  ciprofloxacin (CIPRO) tablet 500 mg        500 mg Oral 2 times daily 11/17/20 0903     11/14/20 1600  metroNIDAZOLE (FLAGYL) IVPB 500 mg  Status:  Discontinued        500 mg 100 mL/hr over 60 Minutes Intravenous Every 8 hours 11/14/20 1407 11/17/20 0903   11/14/20 1500  ceFEPIme (MAXIPIME) 2 g in sodium chloride 0.9 % 100 mL IVPB  Status:  Discontinued        2 g 200 mL/hr over 30 Minutes Intravenous Every 8 hours 11/14/20 1414 11/17/20 0903   11/14/20 0615  ceFEPIme (MAXIPIME) 2 g in sodium chloride 0.9 % 100 mL IVPB       "And" Linked Group Details   2 g 200 mL/hr over 30 Minutes Intravenous  Once 11/14/20 0610 11/14/20 0701   11/14/20 0615  metroNIDAZOLE (FLAGYL)  IVPB 500 mg       "And" Linked Group Details   500 mg 100 mL/hr over 60 Minutes Intravenous  Once 11/14/20 0610 11/14/20 9983       Assessment/Plan Pelvic abscess - likely diverticular origin - s/p IR drain placement 5/30  - follow abscess cultures: gram +cocci, gram - rods - WBC improving 11.9 (14.6). Transition to oral abx to complete a 5 day course total from drain placement - end date 6/4 - tolerating soft diet - could advance to regular but he still has low appetite and he prefers soft  Encouraged OOB to chair and ambulate more today  FEN: soft  ID: cefepime 5/28>6/1 flagyl IV 5/29>6/1, cipro PO 6/1>, flagyl PO 6/1 VTE: SCDs, okay for pharmaceutical prophylaxis from surgical standpoint if indicated  Disposition: follow culture. Transition to PO antibiotics today and monitor WBC. Likely stable for d/c in next 24 hours from surgical perspective.  Will need follow up with GI in the next 6-8 weeks for colonoscopy which I discussed with  him   LOS: 2 days    Eric Form, [facility] 11/17/2020, 9:38 AM Please see Amion for pager number during day hours 7:00am-4:30pm

## 2020-11-17 NOTE — Discharge Instructions (Addendum)
[email or web address]  Percutaneous Abscess Drain Placement, Care After This sheet gives you information about how to care for yourself after your procedure. Your health care provider may also give you more specific instructions. If you have problems or questions, contact your health care provider. What can I expect after the procedure? After the procedure, it is common to have:  A small amount of bruising and discomfort in the area where the drainage tube (catheter) was placed.  Sleepiness and fatigue. This should go away after the medicines you were given have worn off. Follow these instructions at home: Incision care  Follow instructions from your health care provider about how to take care of your incision. Make sure you: ? Wash your hands with soap and water for at least 20 seconds before and after you change your bandage (dressing). If soap and water are not available, use hand sanitizer. ? Change your dressing as told by your health care provider. ? Leave stitches (sutures), skin glue, or adhesive strips in place. These skin closures may need to stay in place for 2 weeks or longer. If adhesive strip edges start to loosen and curl up, you may trim the loose edges. Do not remove adhesive strips completely unless your health care provider tells you to do that.  Check your incision area every day for signs of infection. Check for: ? More redness, swelling, or pain. ? More fluid or blood. ? Warmth. ? Pus or a bad smell.   Catheter care  Follow instructions from your health care provider about emptying and cleaning your catheter and collection bag or drainage bulb. You may need to clean the catheter every day so it does not clog.  If directed, write down the following information every time you empty your bag: ? The date and time. ? The amount of drainage.  Check for fluid leaking from around your catheter (instead of fluid draining through your catheter). This may be a sign  that the drain is no longer working correctly. Activity  Rest at home for 1-2 days after your procedure.  Return to your normal activities as told by your health care provider. Ask your health care provider what activities are safe for you.  If you were given a sedative during the procedure, it can affect you for several hours. Do not drive or operate machinery until your health care provider says that it is safe. General instructions  Take over-the-counter and prescription medicines only as told by your health care provider.  If you were prescribed an antibiotic medicine, take it as told by your health care provider. Do not stop using the antibiotic even if you start to feel better.  Do not take showers, take baths, swim, or use a hot tub for 24 hours after your procedure or until your health care provider says that this is okay.  Do not use any products that contain nicotine or tobacco, such as cigarettes, e-cigarettes, and chewing tobacco. If you need help quitting, ask your health care provider.  Keep all follow-up visits as told by your health care provider. This is important. Contact a health care provider if:  You have less than 10 mL of drainage a day for 2-3 days in a row, or as directed by your health care provider.  You have any of these signs of infection: ? More redness, swelling, or pain around your incision area. ? More fluid or blood coming from your incision area. ? Warmth coming from your incision area. ? Pus or   a bad smell coming from your incision area.  You have fluid leaking from around your catheter (instead of through your catheter).  You have a fever or chills.  You have pain that does not get better with medicine. Get help right away if:  Your catheter comes out.  You suddenly stop having drainage from your catheter.  You suddenly have blood in the fluid that is draining from your catheter.  You become dizzy or you faint.  You develop a  rash.  You have nausea or vomiting.  You have difficulty breathing or you feel short of breath.  You develop chest pain.  You have problems with your speech or vision.  You have trouble balancing or moving your arms or legs. Summary  It is common to have a small amount of bruising and discomfort in the area where the drainage tube (catheter) was placed.  Follow instructions from your health care provider about emptying and cleaning your catheter and collection bag or drainage bulb.  You may be directed to record the amount of drainage from the bag every time you empty it.  Contact a health care provider if you have more redness, swelling, or pain around your incision area or if you have pain that does not get better with medicine. This information is not intended to replace advice given to you by your health care provider. Make sure you discuss any questions you have with your health care provider. Document Revised: 05/31/2019 Document Reviewed: 05/31/2019 Elsevier Patient Education  2021 [employer]   Low-Fiber Eating Plan Fiber is found in fruits, vegetables, whole grains, and beans. Eating a diet low in fiber helps to reduce how often you have bowel movements and the amount of stool you produce. A low-fiber eating plan may help your digestive system heal if you:  Have certain conditions, such as Crohn's disease, diverticulitis, or irritable bowel syndrome (IBS), and are having a flare-up.  Recently had radiation therapy on your pelvis or bowel.  Recently had intestinal surgery.  Have a new surgical opening in your abdomen (colostomy or ileostomy).  Have an intestine that has narrowed (stricture). Your health care provider will tell you how long to stay on this diet and may recommend that you work with a dietitian. What are tips for following this plan? Reading food labels  Check the nutrition facts label on food products for the amount of dietary fiber.  Choose foods  that have less than 2 grams (g) of fiber per serving.   Cooking  Use white flour for baking and cooking.  Cook meat using methods that keep it tender, such as braising or poaching.  Cook eggs until the yolk is completely solid.  Cook with healthy oils, such as olive oil or canola oil. Meal planning  Eat 5-6 small meals throughout the day instead of 3 large meals.  If you are lactose intolerant: ? Choose low-lactose dairy foods. ? Do not eat dairy foods if told by your health care provider or dietitian.  Limit fats and oils to less than 8 teaspoons (39 mL) a day.  Eat small portions of desserts.  Limit acidic, spicy, or fried foods to reduce gas, bloating, and discomfort. General information  Follow instructions from your health care provider or dietitian about how much fiber you should have each day.  Most people on a low-fiber eating plan should eat less than 10 g of fiber a day. Your daily fiber goal is _________________ g.  Take vitamin and mineral  supplements as told by your health care provider or dietitian. Chewable or liquid forms are best when on this eating plan. A gummy vitamin is not recommended. What foods should I eat? Fruits Soft-cooked or canned fruits without skin and seeds. Ripe banana. Applesauce. Fruit juice without pulp. Vegetables Well-cooked or canned vegetables without skin, seeds, or stems. Cooked potatoes without skins. Vegetable juice. Grains All bread and crackers made with white flour. Waffles, pancakes, and [place] toast. Bagels. Pretzels. Melba toast, zwieback, and matzoh. Cooked and dried cereals that do not have whole grains, added fiber, seeds, or dried fruit. Denzil Magnuson. Hot and cold cereals made with refined corn, rice, or oats. Plain pasta and noodles. White rice. Meats and other proteins Ground meat. Tender cuts of meat or poultry. Eggs. Fish, seafood, and shellfish. Smooth nut butters. Tofu. Dairy All milk products and drinks. Lactose-free  milk, including rice, soy, and almond milk. Yogurt without fruit, nuts, chocolate, or granola mixed in. Sour cream. Cottage cheese. Cheese. Fats and oils Olive oil, canola oil, sunflower oil, flaxseed oil, avocado oil, and grapeseed oil. Mayonnaise. Cream cheese. Margarine. Butter. Beverages Decaf coffee. Fruit and vegetable juices. Smoothies (in small amounts, with no pulp or skins, and with fruits from the recommended list). Sports drinks. Herbal tea. Water. Sweets and desserts Plain cakes. Cookies. Cream pies and pies made with recommended fruits. Pudding. Custard. Fruit gelatin. Sherbet. Ice pops. Ice cream without nuts. Hard candy. Honey. Jelly. Molasses. Syrups. Chocolate. Marshmallows. Gumdrops. Seasonings and condiments Ketchup. Mild mustard. Mild salad dressings. Plain gravies. Vinegar. Spices in moderation. Salt. Sugar. Other foods Bouillon. Broth. Cream and strained soups made from recommended foods. Casseroles made with recommended foods. The items listed above may not be a complete list of foods and beverages you can eat. Contact a dietitian for more information. What foods should I avoid? Fruits Raw or dried fruit. Berries. Fruit juice with pulp. Prune juice. Vegetables Potato skins. Raw or undercooked vegetables. All beans and bean sprouts. Cooked greens. Corn. Peas. Cabbage. Beets. Broccoli. Brussels sprouts. Cauliflower. Mushrooms. Onions. Peppers. Parsnips. Okra. Sauerkraut. Grains Whole-wheat, whole-grain, or multigrain breads, cereals, or crackers. Rye bread. Cereals with nuts, raisins, or coconut. Bran. Granola. High-fiber cereals. Cornmeal or corn bread. Whole-grain pasta. Wild or brown rice. Quinoa. Popcorn. Buckwheat. Wheat germ. Meats and other proteins Tough, fibrous meats with gristle. Fatty meat. Poultry with skin. Fried meat, Environmental education officer, or fish. Precooked or cured meat, such as sausages or meat loaves. Tomasa Blase. Hot dogs. Nuts and chunky nut butter. Dried peas, beans, and  lentils. Hummus. Dairy Yogurt with fruit, nuts, chocolate, or granola mixed in. Full-fat dairy such as whole milk, ice cream, or sour cream. Beverages Caffeinated coffee and teas. Fats and oils Avocado. Coconut. Butter. Sweets and desserts Desserts, cookies, or candies that contain nuts or coconut. Dried fruit. Jams and preserves with seeds. Marmalade. Any dessert made with fruits or grains that are not recommended. Seasonings and condiments Relish. Horseradish. Rosita Fire. Olives. Other foods Corn tortilla chips. Soups made with vegetables or grains that are not recommended. The items listed above may not be a complete list of foods and beverages you should avoid. Contact a dietitian for more information. Summary  Most people on a low-fiber eating plan should eat less than 10 grams of fiber a day. Follow recommendations from your health care provider or dietitian about how much fiber you should have each day.  Always check nutrition facts labels to see the dietary fiber amount in packaged foods. A low-fiber food will have less  than 2 grams of fiber per serving.  Try to avoid whole grains, raw fruits and vegetables, dried fruit, tough cuts of meat, nuts, and seeds.  Take a vitamin and mineral supplement as told by your health care provider or dietitian. This information is not intended to replace advice given to you by your health care provider. Make sure you discuss any questions you have with your health care provider. Document Revised: 10/09/2019 Document Reviewed: 10/09/2019 Elsevier Patient Education  2021 [employer] Inc.    Diverticulitis  Diverticulitis is when small pouches in your colon (large intestine) get infected or swollen. This causes pain in the belly (abdomen) and watery poop (diarrhea). These pouches are called diverticula. The pouches form in people who have a condition called diverticulosis. What are the causes? This condition may be caused by poop (stool) that gets  trapped in the pouches in your colon. The poop lets germs (bacteria) grow in the pouches. This causes the infection. What increases the risk? You are more likely to get this condition if you have small pouches in your colon. The risk is higher if:  You are overweight or very overweight (obese).  You do not exercise enough.  You drink alcohol.  You smoke or use products with tobacco in them.  You eat a diet that has a lot of red meat such as beef, pork, or lamb.  You eat a diet that does not have enough fiber in it.  You are older than 46 years of age. What are the signs or symptoms?  Pain in the belly. Pain is often on the left side, but it may be in other areas.  Fever and feeling cold.  Feeling like you may vomit.  Vomiting.  Having cramps.  Feeling full.  Changes to how often you poop.  Blood in your poop. How is this treated? Most cases are treated at home by:  Taking over-the-counter pain medicines.  Following a clear liquid diet.  Taking antibiotic medicines.  Resting. Very bad cases may need to be treated at a hospital. This may include:  Not eating or drinking.  Taking prescription pain medicine.  Getting antibiotic medicines through an IV tube.  Getting fluid and food through an IV tube.  Having surgery. When you are feeling better, your doctor may tell you to have a test to check your colon (colonoscopy). Follow these instructions at home: Medicines  Take over-the-counter and prescription medicines only as told by your doctor. These include: ? Antibiotics. ? Pain medicines. ? Fiber pills. ? Probiotics. ? Stool softeners.  If you were prescribed an antibiotic medicine, take it as told by your doctor. Do not stop taking the antibiotic even if you start to feel better.  Ask your doctor if the medicine prescribed to you requires you to avoid driving or using machinery. Eating and drinking  Follow a diet as told by your doctor.  When you  feel better, your doctor may tell you to change your diet. You may need to eat a lot of fiber. Fiber makes it easier to poop (have a bowel movement). Foods with fiber include: ? Berries. ? Beans. ? Lentils. ? Green vegetables.  Avoid eating red meat.   General instructions  Do not use any products that contain nicotine or tobacco, such as cigarettes, e-cigarettes, and chewing tobacco. If you need help quitting, ask your doctor.  Exercise 3 or more times a week. Try to get 30 minutes each time. Exercise enough to sweat and make your  heart beat faster.  Keep all follow-up visits as told by your doctor. This is important. Contact a doctor if:  Your pain does not get better.  You are not pooping like normal. Get help right away if:  Your pain gets worse.  Your symptoms do not get better.  Your symptoms get worse very fast.  You have a fever.  You vomit more than one time.  You have poop that is: ? Bloody. ? Black. ? Tarry. Summary  This condition happens when small pouches in your colon get infected or swollen.  Take medicines only as told by your doctor.  Follow a diet as told by your doctor.  Keep all follow-up visits as told by your doctor. This is important. This information is not intended to replace advice given to you by your health care provider. Make sure you discuss any questions you have with your health care provider. Document Revised: 03/17/2019 Document Reviewed: 03/17/2019 Elsevier Patient Education  2021 [employer]

## 2020-11-17 NOTE — Progress Notes (Signed)
PROGRESS NOTE    Justin Meadows  ZOX:[id number] DOB: [birth year] DOA: 11/14/2020 PCP: Pcp, No   Brief Narrative:  The patient is a 45 year old male with no significant past medical history presented with abdominal pain.  CT scan was done and showed a pelvic abscess.  He was evaluated by general surgery and thought it is more from diverticulitis.  He underwent an IR drain placement with culture and aspiration on 11/15/2020.  He was placed on IV antibiotics and this has now been transitioned to p.o. ciprofloxacin and Flagyl.  Anticipating discharging home in next 24 to 48 hours once cleared by General Surgery and IR.  Assessment & Plan:   Principal Problem:   Pelvic abscess in male [facility]) Active Problems:   Abdominal pain  Pelvic Abscess -Patient presented with abdominal and pelvic pain with approximately 8 cm pelvic abscess with adjacent rectosigmoid and colonic wall thickening -Had a left transgluteal drain drain placement by interventional radiology Dr. Grace Isaac on 11/15/2020 -WBC is trending down after dran placement and went from 17.1 -> 14.6 -> 11.9 and currently is afebrile -Interventional radiology continuing drain irrigation output monitoring as well as frequent lab checks -They recommend obtaining a follow-up CT scan once output is minimal and will likely need a drain injection study before removal if patient is discharged home with a drain recommend daily irrigation with 5 cc of sterile saline with output recording and gauze dressing changes every 1 to 2 days -If he does go home in the next few days they are recommending following up with IR drain clinic -He was placed on IV antibiotics with IV cefepime and Flagyl and transition to p.o. ciprofloxacin and p.o. Flagyl and this has been transitioned to p.o. today by general surgery and they are recommending transitioning to oral antibiotics to complete a 5-day course total from the drain placement with a end date of 11/20/2020 -General surgery  recommending following for next 24 hours and if stable likely can be discharged home -Drain Cx showing: Gram Stain ABUNDANT WBC PRESENT, PREDOMINANTLY PMN  ABUNDANT GRAM POSITIVE COCCI  MODERATE GRAM NEGATIVE RODS   Culture MODERATE GRAM NEGATIVE RODS  CULTURE REINCUBATED FOR BETTER GROWTH  IDENTIFICATION AND SUSCEPTIBILITIES TO FOLLOW  HOLDING FOR POSSIBLE ANAEROBE  Performed at [facility], [address], [place], [place] [address]   Report Status PENDING    -They are recommending the patient to follow-up with GI in the next 6 to 8 weeks for colonoscopy -Continue with soft diet and advance to regular diet as tolerated -Tinea with pain control with acetaminophen 650 mg p.o. every 6 as needed for fever, mild pain or headache, oxycodone IR 5 mg p.o. every 6 as needed for moderate pain, as well as 100 mcg of fentanyl every 1 hour as needed severe pain but using p.o. Versed for breakthrough -General surgery recommending out of bed to chair and ambulation more  Iron Deficiency Normocytic Anemia  -Patient's Hgb/Hct went from 11.2/34.1 -> 12.1/36.7 -> 11.2/34.1 -Iron Panel done and showed an iron level of 18, a U IBC of 209, TIBC of 227, and saturation ratios of 8% -Continue to Monitor for S/Sx of Bleeding; Currently no overt bleeding noted -Repeat CBC int the AM  Hypokalemia -Mild -Patient's potassium this morning was 3.4 -Replete with p.o. KCl 40 mEq twice daily x2 doses -Continue monitor and trend and repeat CMP in  Hyperglycemia -Patient blood sugar has been ranging from 100-107 -Check hemoglobin A1c in the a.m. -If necessary will place on sensitive  Novolog sliding scale insulin AC   DVT prophylaxis: SCDs Code Status: FULL CODE  Family Communication: No family present at bedside  Disposition Plan: Anticipating discharging home in next 24 to 48 hours  Status is: Inpatient  Remains inpatient appropriate because:Unsafe d/c plan, IV treatments appropriate due to intensity  of illness or inability to take PO and Inpatient level of care appropriate due to severity of illness   Dispo: The patient is from: Home              Anticipated d/c is to: Home              Patient currently is not medically stable to d/c.   Difficult to place patient No  Consultants:   General Surgery  Interventional Radiology   Procedures: CT Guided Drain placement on 11/15/20  Antimicrobials:  Anti-infectives (From admission, onward)   Start     Dose/Rate Route Frequency Ordered Stop   11/14/20 1600  metroNIDAZOLE (FLAGYL) IVPB 500 mg        500 mg 100 mL/hr over 60 Minutes Intravenous Every 8 hours 11/14/20 1407     11/14/20 1500  ceFEPIme (MAXIPIME) 2 g in sodium chloride 0.9 % 100 mL IVPB        2 g 200 mL/hr over 30 Minutes Intravenous Every 8 hours 11/14/20 1414     11/14/20 0615  ceFEPIme (MAXIPIME) 2 g in sodium chloride 0.9 % 100 mL IVPB       "And" Linked Group Details   2 g 200 mL/hr over 30 Minutes Intravenous  Once 11/14/20 0610 11/14/20 0701   11/14/20 0615  metroNIDAZOLE (FLAGYL) IVPB 500 mg       "And" Linked Group Details   500 mg 100 mL/hr over 60 Minutes Intravenous  Once 11/14/20 0610 11/14/20 0093        Subjective: Seen and examined at bedside and he was doing okay.  Was complaining of left buttock pain.  No nausea or vomiting.  Denies any lightheadedness or dizziness.  Denies any other concerns or complaints at this time and hopeful to go home soon.  Objective: Vitals:   11/16/20 1317 11/16/20 1321 11/16/20 2040 11/17/20 0433  BP: (!) 159/95  (!) 152/89 (!) 160/70  Pulse: 71  67 61  Resp: 18  17 16   Temp:  99.3 F (37.4 C) 99.5 F (37.5 C) 98.7 F (37.1 C)  TempSrc:  Oral Oral Oral  SpO2: 96%  98% 99%  Weight:      Height:        Intake/Output Summary (Last 24 hours) at 11/17/2020 0801 Last data filed at 11/17/2020 0600 Gross per 24 hour  Intake 1259.76 ml  Output 75 ml  Net 1184.76 ml   Filed Weights   11/14/20 0335  Weight: 74.8  kg   Examination: Physical Exam:  Constitutional: WN/WD male in NAD and appears calm but slightly uncomfortable Eyes: Lids and conjunctivae normal, sclerae anicteric  ENMT: External Ears, Nose appear normal. Grossly normal hearing. Neck: Appears normal, supple, no cervical masses, normal ROM, no appreciable thyromegaly; no JVD  Respiratory: Diminished to auscultation bilaterally, no wheezing, rales, rhonchi or crackles. No accessory muscle use.  Cardiovascular: RRR, no murmurs / rubs / gallops. S1 and S2 auscultated. No edema Abdomen: Soft, non-tender, non-distended. No masses palpated. Bowel sounds positive.  GU: Deferred. Musculoskeletal: No clubbing / cyanosis of digits/nails. No joint deformity upper and lower extremities. Left Buttock Drain in place Skin: No rashes, lesions, ulcers. No induration;  Warm and dry.  Neurologic: CN 2-12 grossly intact with no focal deficits on a limited skin evaluation. Romberg sign and cerebellar reflexes not assessed.  Psychiatric: Normal judgment and insight. Alert and oriented x 3. Normal mood and appropriate affect.   Data Reviewed: I have personally reviewed following labs and imaging studies  CBC: Recent Labs  Lab 11/14/20 0410 11/15/20 0453 11/16/20 1219 11/17/20 0426  WBC 20.6* 17.1* 14.6* 11.9*  NEUTROABS 17.1*  --   --   --   HGB 12.8* 11.2* 12.1* 11.2*  HCT 38.2* 34.1* 36.7* 34.1*  MCV 87.8 89.5 90.0 88.8  PLT 327 296 344 338   Basic Metabolic Panel: Recent Labs  Lab 11/14/20 0410 11/14/20 1434 11/15/20 0453  NA 137  --  140  K 2.7*  --  3.8  CL 100  --  107  CO2 26  --  27  GLUCOSE 155*  --  107*  BUN 8  --  10  CREATININE 0.86  --  1.05  CALCIUM 8.5*  --  8.6*  MG  --  1.8  --    GFR: Estimated Creatinine Clearance: 88.8 mL/min (by C-G formula based on SCr of 1.05 mg/dL). Liver Function Tests: Recent Labs  Lab 11/14/20 0410 11/15/20 0453  AST 17 15  ALT 27 24  ALKPHOS 65 89  BILITOT 0.4 0.5  PROT 7.8 6.9   ALBUMIN 3.4* 2.8*   No results for input(s): LIPASE, AMYLASE in the last 168 hours. No results for input(s): AMMONIA in the last 168 hours. Coagulation Profile: No results for input(s): INR, PROTIME in the last 168 hours. Cardiac Enzymes: No results for input(s): CKTOTAL, CKMB, CKMBINDEX, TROPONINI in the last 168 hours. BNP (last 3 results) No results for input(s): PROBNP in the last 8760 hours. HbA1C: No results for input(s): HGBA1C in the last 72 hours. CBG: No results for input(s): GLUCAP in the last 168 hours. Lipid Profile: No results for input(s): CHOL, HDL, LDLCALC, TRIG, CHOLHDL, LDLDIRECT in the last 72 hours. Thyroid Function Tests: No results for input(s): TSH, T4TOTAL, FREET4, T3FREE, THYROIDAB in the last 72 hours. Anemia Panel: Recent Labs    11/14/20 1434  TIBC 227*  IRON 18*   Sepsis Labs: No results for input(s): PROCALCITON, LATICACIDVEN in the last 168 hours.  Recent Results (from the past 240 hour(s))  Resp Panel by RT-PCR (Flu A&B, Covid)     Status: None   Collection Time: 11/14/20  6:18 AM   Specimen: Nasopharyngeal(NP) swabs in vial transport medium  Result Value Ref Range Status   SARS Coronavirus 2 by RT PCR NEGATIVE NEGATIVE Final    Comment: (NOTE) SARS-CoV-2 target nucleic acids are NOT DETECTED.  The SARS-CoV-2 RNA is generally detectable in upper respiratory specimens during the acute phase of infection. The lowest concentration of SARS-CoV-2 viral copies this assay can detect is 138 copies/mL. A negative result does not preclude SARS-Cov-2 infection and should not be used as the sole basis for treatment or other patient management decisions. A negative result may occur with  improper specimen collection/handling, submission of specimen other than nasopharyngeal swab, presence of viral mutation(s) within the areas targeted by this assay, and inadequate number of viral copies(<138 copies/mL). A negative result must be combined  with clinical observations, patient history, and epidemiological information. The expected result is Negative.  Fact Sheet for Patients:  BloggerCourse.comhttps://www.fda.gov/media/152166/download  Fact Sheet for Healthcare Providers:  SeriousBroker.ithttps://www.fda.gov/media/152162/download  This test is no t yet approved or cleared by the [place] FDA and  has been authorized for detection and/or diagnosis of SARS-CoV-2 by FDA under an Emergency Use Authorization (EUA). This EUA will remain  in effect (meaning this test can be used) for the duration of the COVID-19 declaration under Section 564(b)(1) of the Act, 21 U.S.C.section 360bbb-3(b)(1), unless the authorization is terminated  or revoked sooner.       Influenza A by PCR NEGATIVE NEGATIVE Final   Influenza B by PCR NEGATIVE NEGATIVE Final    Comment: (NOTE) The Xpert Xpress SARS-CoV-2/FLU/RSV plus assay is intended as an aid in the diagnosis of influenza from Nasopharyngeal swab specimens and should not be used as a sole basis for treatment. Nasal washings and aspirates are unacceptable for Xpert Xpress SARS-CoV-2/FLU/RSV testing.  Fact Sheet for Patients: BloggerCourse.com  Fact Sheet for Healthcare Providers: SeriousBroker.it  This test is not yet approved or cleared by the [place] FDA and has been authorized for detection and/or diagnosis of SARS-CoV-2 by FDA under an Emergency Use Authorization (EUA). This EUA will remain in effect (meaning this test can be used) for the duration of the COVID-19 declaration under Section 564(b)(1) of the Act, 21 U.S.C. section 360bbb-3(b)(1), unless the authorization is terminated or revoked.  Performed at [facility], [address] [place], [place] [address]   Gastrointestinal Panel by PCR , Stool     Status: None   Collection Time: 11/14/20  6:28 PM   Specimen: STOOL  Result Value Ref Range Status   Campylobacter species NOT  DETECTED NOT DETECTED Final   Plesimonas shigelloides NOT DETECTED NOT DETECTED Final   Salmonella species NOT DETECTED NOT DETECTED Final   Yersinia enterocolitica NOT DETECTED NOT DETECTED Final   Vibrio species NOT DETECTED NOT DETECTED Final   Vibrio cholerae NOT DETECTED NOT DETECTED Final   Enteroaggregative E coli (EAEC) NOT DETECTED NOT DETECTED Final   Enteropathogenic E coli (EPEC) NOT DETECTED NOT DETECTED Final   Enterotoxigenic E coli (ETEC) NOT DETECTED NOT DETECTED Final   Shiga like toxin producing E coli (STEC) NOT DETECTED NOT DETECTED Final   Shigella/Enteroinvasive E coli (EIEC) NOT DETECTED NOT DETECTED Final   Cryptosporidium NOT DETECTED NOT DETECTED Final   Cyclospora cayetanensis NOT DETECTED NOT DETECTED Final   Entamoeba histolytica NOT DETECTED NOT DETECTED Final   Giardia lamblia NOT DETECTED NOT DETECTED Final   Adenovirus F40/41 NOT DETECTED NOT DETECTED Final   Astrovirus NOT DETECTED NOT DETECTED Final   Norovirus GI/GII NOT DETECTED NOT DETECTED Final   Rotavirus A NOT DETECTED NOT DETECTED Final   Sapovirus (I, II, IV, and V) NOT DETECTED NOT DETECTED Final    Comment: Performed at [facility], [address] [place], [place] [address]  C Difficile Quick Screen w PCR reflex     Status: None   Collection Time: 11/14/20  6:28 PM   Specimen: STOOL  Result Value Ref Range Status   C Diff antigen NEGATIVE NEGATIVE Final   C Diff toxin NEGATIVE NEGATIVE Final   C Diff interpretation No C. difficile detected.  Final    Comment: Performed at [facility], [address] [place], [place] [address]  Aerobic/Anaerobic Culture (surgical/deep wound)     Status: None (Preliminary result)   Collection Time: 11/15/20 12:26 PM   Specimen: Abscess  Result Value Ref Range Status   Specimen Description   Final    ABSCESS LEFT TRANSGLUTEAL DRAIN PLACEMENT Performed at [facility], [address], [place],  [place] [address]     Special Requests   Final    NONE Performed at [facility], [address] [place], [place] [address]    Gram Stain   Final    ABUNDANT WBC PRESENT, PREDOMINANTLY PMN ABUNDANT GRAM POSITIVE COCCI MODERATE GRAM NEGATIVE RODS    Culture   Final    CULTURE REINCUBATED FOR BETTER GROWTH Performed at [facility], [address] [place], [place] [address]    Report Status PENDING  Incomplete     RN Pressure Injury Documentation:     Estimated body mass index is 24.37 kg/m as calculated from the following:   Height as of this encounter: 5\' 9"  (1.753 m).   Weight as of this encounter: 74.8 kg.  Malnutrition Type:   Malnutrition Characteristics:   Nutrition Interventions:    Radiology Studies: CT IMAGE GUIDED FLUID DRAIN BY CATHETER  Result Date: 11/16/2020 INDICATION: Pericolonic abscess. Please perform CT-guided percutaneous drainage catheter placement for infection source control purposes. EXAM: CT IMAGE GUIDED FLUID DRAIN BY CATHETER COMPARISON:  CT abdomen and pelvis-11/14/2020 MEDICATIONS: The patient is currently admitted to the hospital and receiving intravenous antibiotics. The antibiotics were administered within an appropriate time frame prior to the initiation of the procedure. ANESTHESIA/SEDATION: Moderate (conscious) sedation was employed during this procedure. A total of Versed 4 mg, Benadryl 25 mg and Fentanyl 100 mcg was administered intravenously. Moderate Sedation Time: 19 minutes. The patient's level of consciousness and vital signs were monitored continuously by radiology nursing throughout the procedure under my direct supervision. CONTRAST:  None COMPLICATIONS: None immediate. PROCEDURE: Informed written consent was obtained from the patient after a discussion of the risks, benefits and alternatives to treatment. The patient was placed prone on the CT gantry and a pre procedural CT was performed demonstrating slight decrease in size of the  known abscess/fluid collection within the midline of the pelvis with dominant component measuring approximately 5.0 x 3.4 cm (image 20, series 2), previously, 7.9 x 6.9 cm. The procedure was planned. A timeout was performed prior to the initiation of the procedure. The skin overlying the left buttocks was prepped and draped in the usual sterile fashion. The overlying soft tissues were anesthetized with 1% lidocaine with epinephrine. Appropriate trajectory was planned with the use of a 22 gauge spinal needle. An 18 gauge trocar needle was advanced into the abscess/fluid collection and a short Amplatz super stiff wire was coiled within the collection. Appropriate positioning was confirmed with a limited CT scan. The tract was serially dilated allowing placement of a 10 11/16/2020 all-purpose drainage catheter. Appropriate positioning was confirmed with a limited postprocedural CT scan. Approximately 40 ml of purulent fluid was aspirated. The tube was connected to a drainage bag and sutured in place. A dressing was placed. The patient tolerated the procedure well without immediate post procedural complication. IMPRESSION: Successful CT guided placement of a 10 French all purpose drain catheter into the pelvic abscess with aspiration of 40 mL of purulent fluid. Samples were sent to the laboratory as requested by the ordering clinical team. Electronically Signed   By: [place] M.D.   On: 11/16/2020 08:05   Scheduled Meds: . sodium chloride flush  5 mL Intracatheter Q8H   Continuous Infusions: . sodium chloride Stopped (11/14/20 1050)  . ceFEPime (MAXIPIME) IV 2 g (11/17/20 0538)  . metronidazole 500 mg (11/16/20 2307)    LOS: 2 days   [id number], DO [facility] PAGER is on Merlene Laughter  If 7PM-7AM, please contact night-coverage [email or web address]

## 2020-11-17 NOTE — Progress Notes (Signed)
Referring Physician(s): Cornett,T   Supervising Physician: Marliss Coots  Patient Status:  [facility] - In-pt  Chief Complaint: Pelvic pain/abscess   Subjective: Patient doing okay today.  Still has some soreness at left transgluteal drain insertion site; he is passing flatus and had BM this morning; denies nausea or vomiting   Allergies: Patient has no known allergies.  Medications: Prior to Admission medications   Medication Sig Start Date End Date Taking? Authorizing Provider  dicyclomine (BENTYL) 20 MG tablet Take 1 tablet (20 mg total) by mouth 4 (four) times daily -  before meals and at bedtime. 11/12/20  Yes Wieters, Hallie C, PA-C  ibuprofen (ADVIL) 200 MG tablet Take 400-600 mg by mouth every 6 (six) hours as needed for fever, headache or mild pain.   Yes [provider]     Vital Signs: BP (!) 160/70 (BP Location: Left Arm)   Pulse 61   Temp 98.7 F (37.1 C) (Oral)   Resp 16   Ht 5\' 9"  (1.753 m)   Wt 165 lb (74.8 kg)   SpO2 99%   BMI 24.37 kg/m   Physical Exam awake, alert.  Left transgluteal drain intact, insertion site mild to moderately tender, output 75 cc feculent fluid with air also in bag, drain irrigated without difficulty.  Imaging: CT ABDOMEN PELVIS W CONTRAST  Result Date: 11/14/2020 CLINICAL DATA:  Abdominal pain EXAM: CT ABDOMEN AND PELVIS WITH CONTRAST TECHNIQUE: Multidetector CT imaging of the abdomen and pelvis was performed using the standard protocol following bolus administration of intravenous contrast. CONTRAST:  11/16/2020 OMNIPAQUE IOHEXOL 300 MG/ML  SOLN COMPARISON:  None. FINDINGS: Lower chest: Lung bases are clear. Hepatobiliary: 2.0 cm subcapsular cyst in segment 4 (series 2/image 27). Gallbladder is unremarkable. No intrahepatic or extrahepatic ductal dilatation. Pancreas: Within normal limits. Spleen: Within normal limits. Adrenals/Urinary Tract: Adrenal glands are within normal limits. Right renal cysts, measuring up to 2.2 cm in  the lower pole (series 2/image 43). Left kidney is within normal limits. No hydronephrosis. Mildly thick-walled bladder, although underdistended. Stomach/Bowel: Stomach is within normal limits. No evidence of bowel obstruction. Normal appendix (series 2/image 63). Possible mild wall thickening involving the ascending colon (series 2/image 54), although equivocal. Transverse and proximal descending colon are normal. However, there is wall thickening involving the rectosigmoid colon with an associated 7.9 x 6.9 x 6.9 cm pelvic abscess (series 2/image 77). This distorts the adjacent loops of colon and likely communicates with one of the loops, although the direct communication is not well visualized the current CT. Vascular/Lymphatic: No evidence of abdominal aortic aneurysm. Atherosclerotic calcifications of the abdominal aorta and branch vessels. No suspicious abdominopelvic lymphadenopathy. Reproductive: Prostate is unremarkable. Other: Mild lower retroperitoneal/pelvic stranding. No free air. Musculoskeletal: Visualized osseous structures are within normal limits. IMPRESSION: Suspected 7.9 cm pelvic abscess with adjacent rectosigmoid colonic wall thickening, likely related to infectious/inflammatory colitis. No associated free air. These results were called by telephone at the time of interpretation on 11/14/2020 at 6:00 am to provider [facility] , who verbally acknowledged these results. Electronically Signed   By: [facility] M.D.   On: 11/14/2020 06:20   CT IMAGE GUIDED FLUID DRAIN BY CATHETER  Result Date: 11/16/2020 INDICATION: Pericolonic abscess. Please perform CT-guided percutaneous drainage catheter placement for infection source control purposes. EXAM: CT IMAGE GUIDED FLUID DRAIN BY CATHETER COMPARISON:  CT abdomen and pelvis-11/14/2020 MEDICATIONS: The patient is currently admitted to the hospital and receiving intravenous antibiotics. The antibiotics were administered within an appropriate  time frame prior to the initiation of the procedure. ANESTHESIA/SEDATION: Moderate (conscious) sedation was employed during this procedure. A total of Versed 4 mg, Benadryl 25 mg and Fentanyl 100 mcg was administered intravenously. Moderate Sedation Time: 19 minutes. The patient's level of consciousness and vital signs were monitored continuously by radiology nursing throughout the procedure under my direct supervision. CONTRAST:  None COMPLICATIONS: None immediate. PROCEDURE: Informed written consent was obtained from the patient after a discussion of the risks, benefits and alternatives to treatment. The patient was placed prone on the CT gantry and a pre procedural CT was performed demonstrating slight decrease in size of the known abscess/fluid collection within the midline of the pelvis with dominant component measuring approximately 5.0 x 3.4 cm (image 20, series 2), previously, 7.9 x 6.9 cm. The procedure was planned. A timeout was performed prior to the initiation of the procedure. The skin overlying the left buttocks was prepped and draped in the usual sterile fashion. The overlying soft tissues were anesthetized with 1% lidocaine with epinephrine. Appropriate trajectory was planned with the use of a 22 gauge spinal needle. An 18 gauge trocar needle was advanced into the abscess/fluid collection and a short Amplatz super stiff wire was coiled within the collection. Appropriate positioning was confirmed with a limited CT scan. The tract was serially dilated allowing placement of a 10 [place] all-purpose drainage catheter. Appropriate positioning was confirmed with a limited postprocedural CT scan. Approximately 40 ml of purulent fluid was aspirated. The tube was connected to a drainage bag and sutured in place. A dressing was placed. The patient tolerated the procedure well without immediate post procedural complication. IMPRESSION: Successful CT guided placement of a 10 French all purpose drain catheter into  the pelvic abscess with aspiration of 40 mL of purulent fluid. Samples were sent to the laboratory as requested by the ordering clinical team. Electronically Signed   By: Simonne Come M.D.   On: 11/16/2020 08:05    Labs:  CBC: Recent Labs    11/14/20 0410 11/15/20 0453 11/16/20 1219 11/17/20 0426  WBC 20.6* 17.1* 14.6* 11.9*  HGB 12.8* 11.2* 12.1* 11.2*  HCT 38.2* 34.1* 36.7* 34.1*  PLT 327 296 344 338    COAGS: No results for input(s): INR, APTT in the last 8760 hours.  BMP: Recent Labs    11/14/20 0410 11/15/20 0453 11/17/20 0834  NA 137 140 140  K 2.7* 3.8 3.4*  CL 100 107 102  CO2 26 27 29   GLUCOSE 155* 107* 100*  BUN 8 10 10   CALCIUM 8.5* 8.6* 8.9  CREATININE 0.86 1.05 1.04  GFRNONAA >60 >60 >60    LIVER FUNCTION TESTS: Recent Labs    11/14/20 0410 11/15/20 0453 11/17/20 0834  BILITOT 0.4 0.5 0.1*  AST 17 15 19   ALT 27 24 25   ALKPHOS 65 89 56  PROT 7.8 6.9 7.2  ALBUMIN 3.4* 2.8* 3.0*    Assessment and Plan: Patient with history of abdominal/pelvic pain with approximately 8 cm pelvic abscess with adjacent rectosigmoid colonic wall thickening; status post drain placement on 11/15/2020; afebrile, WBC 11.9 down from 14.6, hemoglobin 11.2 down from 12.1, drain fluid cultures pending; continue with drain irrigation/output monitoring/lab checks; obtain follow-up CT once output minimal; will likely need drain injection before removal as well; if patient discharged home with drain recommend once daily irrigation with 5 cc sterile saline, output recording and gauze dressing changes every 1 to 2 days.  Upon discharge he can be scheduled for follow-up in IR  drain clinic.   Electronically Signed: D. Jeananne Rama, PA-C 11/17/2020, 9:47 AM   I spent a total of 15 minutes at the the patient's bedside AND on the patient's hospital floor or unit, greater than 50% of which was counseling/coordinating care for pelvic abscess drain    Patient ID: Justin Meadows, male   DOB:  1975/09/15, 45 y.o.   MRN: [id number]

## 2020-11-18 DIAGNOSIS — K651 Peritoneal abscess: Secondary | ICD-10-CM | POA: Diagnosis not present

## 2020-11-18 DIAGNOSIS — R109 Unspecified abdominal pain: Secondary | ICD-10-CM | POA: Diagnosis not present

## 2020-11-18 LAB — CBC WITH DIFFERENTIAL/PLATELET
Abs Immature Granulocytes: 0.13 K/uL — ABNORMAL HIGH (ref 0.00–0.07)
Basophils Absolute: 0.1 K/uL (ref 0.0–0.1)
Basophils Relative: 1 %
Eosinophils Absolute: 0.2 K/uL (ref 0.0–0.5)
Eosinophils Relative: 2 %
HCT: 37.6 % — ABNORMAL LOW (ref 39.0–52.0)
Hemoglobin: 12.4 g/dL — ABNORMAL LOW (ref 13.0–17.0)
Immature Granulocytes: 1 %
Lymphocytes Relative: 18 %
Lymphs Abs: 1.9 K/uL (ref 0.7–4.0)
MCH: 29.2 pg (ref 26.0–34.0)
MCHC: 33 g/dL (ref 30.0–36.0)
MCV: 88.5 fL (ref 80.0–100.0)
Monocytes Absolute: 1 K/uL (ref 0.1–1.0)
Monocytes Relative: 10 %
Neutro Abs: 7.3 K/uL (ref 1.7–7.7)
Neutrophils Relative %: 68 %
Platelets: 379 K/uL (ref 150–400)
RBC: 4.25 MIL/uL (ref 4.22–5.81)
RDW: 14.1 % (ref 11.5–15.5)
WBC: 10.5 K/uL (ref 4.0–10.5)
nRBC: 0 % (ref 0.0–0.2)

## 2020-11-18 LAB — COMPREHENSIVE METABOLIC PANEL
ALT: 31 U/L (ref 0–44)
AST: 35 U/L (ref 15–41)
Albumin: 3.1 g/dL — ABNORMAL LOW (ref 3.5–5.0)
Alkaline Phosphatase: 55 U/L (ref 38–126)
Anion gap: 10 (ref 5–15)
BUN: 10 mg/dL (ref 6–20)
CO2: 28 mmol/L (ref 22–32)
Calcium: 9.1 mg/dL (ref 8.9–10.3)
Chloride: 100 mmol/L (ref 98–111)
Creatinine, Ser: 0.98 mg/dL (ref 0.61–1.24)
GFR, Estimated: >60 mL/min (ref >60)
Glucose, Bld: 107 mg/dL — ABNORMAL HIGH (ref 70–99)
Potassium: 3.6 mmol/L (ref 3.5–5.1)
Sodium: 138 mmol/L (ref 135–145)
Total Bilirubin: 0.6 mg/dL (ref 0.3–1.2)
Total Protein: 7.4 g/dL (ref 6.5–8.1)

## 2020-11-18 LAB — MAGNESIUM: Magnesium: 2.1 mg/dL (ref 1.7–2.4)

## 2020-11-18 LAB — PHOSPHORUS: Phosphorus: 3 mg/dL (ref 2.5–4.6)

## 2020-11-18 MED ORDER — SODIUM CHLORIDE 0.9% FLUSH: 5.0000 mL | Freq: Three times a day (TID) | INTRAVENOUS | 0 refills | Status: AC

## 2020-11-18 MED ORDER — METRONIDAZOLE 500 MG PO TABS: 500.0000 mg | ORAL_TABLET | Freq: Three times a day (TID) | ORAL | 0 refills | Status: AC

## 2020-11-18 MED ORDER — ONDANSETRON HCL 4 MG PO TABS
4.0000 mg | ORAL_TABLET | Freq: Four times a day (QID) | ORAL | 0 refills | Status: DC | PRN
Start: 2020-11-18 — End: 2021-07-09

## 2020-11-18 MED ORDER — OXYCODONE HCL 5 MG PO TABS: 5.0000 mg | ORAL_TABLET | Freq: Four times a day (QID) | ORAL | 0 refills | Status: DC | PRN

## 2020-11-18 MED ORDER — ACETAMINOPHEN 325 MG PO TABS: 650.0000 mg | ORAL_TABLET | Freq: Four times a day (QID) | ORAL | 0 refills | Status: AC | PRN

## 2020-11-18 MED ORDER — CIPROFLOXACIN HCL 500 MG PO TABS: 500.0000 mg | ORAL_TABLET | Freq: Two times a day (BID) | ORAL | 0 refills | Status: AC

## 2020-11-18 MED ORDER — SACCHAROMYCES BOULARDII 250 MG PO CAPS: 250.0000 mg | ORAL_CAPSULE | Freq: Two times a day (BID) | ORAL | 0 refills | Status: AC

## 2020-11-18 MED ORDER — SIMETHICONE 80 MG PO CHEW: 80.0000 mg | CHEWABLE_TABLET | Freq: Four times a day (QID) | ORAL | 0 refills | Status: AC | PRN

## 2020-11-18 NOTE — Plan of Care (Signed)

## 2020-11-18 NOTE — Discharge Summary (Signed)
Physician Discharge Summary  Kielan Dreisbach IWO:[id number] DOB: Jun 24, 1975 DOA: 11/14/2020  PCP: Pcp, No  Admit date: 11/14/2020 Discharge date: 11/18/2020  Admitted From: Home Disposition: Home  Recommendations for Outpatient Follow-up:  1. Follow up with PCP in 1-2 weeks 2. Follow up with Interventional Radiology  3. Follow-up with gastroenterology in 6 to 8 weeks colonoscopy 4. Follow up with General Surgery within 1-2 weeks 5. Please obtain CMP/CBC, Mag, Phos in one week 6. Please follow up on the following pending results:  Home Health: No  Equipment/Devices: None    Discharge Condition: Stable CODE STATUS: FULL CODE Diet recommendation: Regular Died   Brief/Interim Summary: The patient is a 45 year old male with no significant past medical history presented with abdominal pain.  CT scan was done and showed a pelvic abscess.  He was evaluated by general surgery and thought it is more from diverticulitis.  He underwent an IR drain placement with culture and aspiration on 11/15/2020.  He was placed on IV antibiotics and this has now been transitioned to p.o. ciprofloxacin and Flagyl.  He improved and was deemed stable for discharge from a interventional radiology and general surgery perspective.  His cultures grew out E. coli.  He will need to follow-up with gastroenterology in outpatient setting for a colonoscopy  Discharge Diagnoses:  Principal Problem:   Pelvic abscess in male [facility]) Active Problems:   Abdominal pain  Pelvic Abscess -Patient presented with abdominal and pelvic pain with approximately 8 cm pelvic abscess with adjacent rectosigmoid and colonic wall thickening -Had a left transgluteal drain drain placement by interventional radiology Dr. Grace Isaac on 11/15/2020 -WBC is trending down after dran placement and went from 17.1 -> 14.6 -> 11.9 -> 10.5 and currently is afebrile -Interventional radiology continuing drain irrigation output monitoring as well as frequent lab  checks -They recommend obtaining a follow-up CT scan once output is minimal and will likely need a drain injection study before removal if patient is discharged home with a drain recommend daily irrigation with 5 cc of sterile saline with output recording and gauze dressing changes every 1 to 2 days -If he does go home in the next few days they are recommending following up with IR drain clinic -He was placed on IV antibiotics with IV cefepime and Flagyl and transition to p.o. ciprofloxacin and p.o. Flagyl and this has been transitioned to p.o. today by general surgery and they are recommending transitioning to oral antibiotics to complete a 5-day course total from the drain placement with a end date of 11/20/2020 -General surgery recommending following for next 24 hours and if stable likely can be discharged home -Drain Cx showing: ABUNDANT WBC PRESENT, PREDOMINANTLY PMN  ABUNDANT GRAM POSITIVE COCCI  MODERATE GRAM NEGATIVE RODS    Culture MODERATE ESCHERICHIA COLI  HOLDING FOR POSSIBLE ANAEROBE   -E Coli is only Resistant to Ampicillin; Anaerobes pending but did show abundant Bacteroides theta Iota micron as well as beta-lactamase positive species -They are recommending the patient to follow-up with GI in the next 6 to 8 weeks for colonoscopy -Continue with soft diet and advance to regular diet as tolerated -Tinea with pain control with acetaminophen 650 mg p.o. every 6 as needed for fever, mild pain or headache, oxycodone IR 5 mg p.o. every 6 as needed for moderate pain, as well as 100 mcg of fentanyl every 1 hour as needed severe pain but using p.o. Versed for breakthrough -General surgery recommending out of bed to chair and ambulation more -Patient will to follow-up  with general surgery outpatient setting for discussion of colon surgery  Iron Deficiency Normocytic Anemia  -Patient's Hgb/Hct went from 11.2/34.1 -> 12.1/36.7 -> 11.2/34.1 -> 12.4/37.6 -Iron Panel done and showed an iron level  of 18, a U IBC of 209, TIBC of 227, and saturation ratios of 8% -Continue to Monitor for S/Sx of Bleeding; Currently no overt bleeding noted -Repeat CBC as an outpatient within 1 week   Hypokalemia -Mild -Patient's potassium this morning was 3.6 -Replete with p.o. KCl 40 mEq twice daily x2 doses again -Continue monitor and trend and repeat CMP within 1 week   Hyperglycemia -Patient blood sugar has been ranging from 100-107 -Likley Reactive  -Check hemoglobin A1c as an outpatient  -If necessary will place on sensitive Novolog sliding scale insulin [facility]  Discharge Instructions  Discharge Instructions    Ambulatory referral to Gastroenterology   Complete by: As directed    Suspected perforated diverticulitis with abscess s/p drain. Needs colonoscopy in 6-8 weeks   What is the reason for referral?: Colonoscopy   Call MD for:  difficulty breathing, headache or visual disturbances   Complete by: As directed    Call MD for:  extreme fatigue   Complete by: As directed    Call MD for:  hives   Complete by: As directed    Call MD for:  persistant dizziness or light-headedness   Complete by: As directed    Call MD for:  persistant nausea and vomiting   Complete by: As directed    Call MD for:  redness, tenderness, or signs of infection (pain, swelling, redness, odor or green/yellow discharge around incision site)   Complete by: As directed    Call MD for:  severe uncontrolled pain   Complete by: As directed    Call MD for:  temperature >100.4   Complete by: As directed    Diet - low sodium heart healthy   Complete by: As directed    Discharge instructions   Complete by: As directed    You were cared for by a hospitalist during your hospital stay. If you have any questions about your discharge medications or the care you received while you were in the hospital after you are discharged, you can call the unit and ask to speak with the hospitalist on call if the hospitalist that took care  of you is not available. Once you are discharged, your primary care physician will handle any further medical issues. Please note that NO REFILLS for any discharge medications will be authorized once you are discharged, as it is imperative that you return to your primary care physician (or establish a relationship with a primary care physician if you do not have one) for your aftercare needs so that they can reassess your need for medications and monitor your lab values.  Follow up with PCP, General Surgery, and [facility]. Take all medications as prescribed. If symptoms change or worsen please return to the ED for evaluation   Discharge wound care:   Complete by: As directed    Dressing changes every 1-2 days   Increase activity slowly   Complete by: As directed      Allergies as of 11/18/2020   No Known Allergies     Medication List    TAKE these medications   acetaminophen 325 MG tablet Commonly known as: TYLENOL Take 2 tablets (650 mg total) by mouth every 6 (six) hours as needed for fever, mild pain or headache.  ciprofloxacin 500 MG tablet Commonly known as: CIPRO Take 1 tablet (500 mg total) by mouth 2 (two) times daily for 8 doses.   dicyclomine 20 MG tablet Commonly known as: BENTYL Take 1 tablet (20 mg total) by mouth 4 (four) times daily -  before meals and at bedtime.   ibuprofen 200 MG tablet Commonly known as: ADVIL Take 400-600 mg by mouth every 6 (six) hours as needed for fever, headache or mild pain.   metroNIDAZOLE 500 MG tablet Commonly known as: FLAGYL Take 1 tablet (500 mg total) by mouth every 8 (eight) hours for 12 doses.   ondansetron 4 MG tablet Commonly known as: ZOFRAN Take 1 tablet (4 mg total) by mouth every 6 (six) hours as needed for nausea.   oxyCODONE 5 MG immediate release tablet Commonly known as: Oxy IR/ROXICODONE Take 1 tablet (5 mg total) by mouth every 6 (six) hours as needed for moderate pain.    saccharomyces boulardii 250 MG capsule Commonly known as: FLORASTOR Take 1 capsule (250 mg total) by mouth 2 (two) times daily for 10 days.   simethicone 80 MG chewable tablet Commonly known as: MYLICON Chew 1 tablet (80 mg total) by mouth every 6 (six) hours as needed for flatulence.   sodium chloride flush 0.9 % Soln Commonly known as: NS 5 mLs by Intracatheter route every 8 (eight) hours.            Discharge Care Instructions  (From admission, onward)         Start     Ordered   11/18/20 0000  Discharge wound care:       Comments: Dressing changes every 1-2 days   11/18/20 1225          Follow-up Information    Romie Levee, MD. Go on 12/13/2020.   Specialties: General Surgery, Colon and Rectal Surgery Why: Your appointment is 6/27 at 11am Please arrive 30 minutes prior to your appointment to check in and fill out paperwork. Bring photo ID and Insurance account manager information: [address] [place] [address] [phone]              No Known Allergies  Consultations:  General Surgery  Interventional Radiology  Procedures/Studies: CT ABDOMEN PELVIS W CONTRAST  Result Date: 11/14/2020 CLINICAL DATA:  Abdominal pain EXAM: CT ABDOMEN AND PELVIS WITH CONTRAST TECHNIQUE: Multidetector CT imaging of the abdomen and pelvis was performed using the standard protocol following bolus administration of intravenous contrast. CONTRAST:  OMNIPAQUE IOHEXOL 300 MG/ML  SOLN COMPARISON:  None. FINDINGS: Lower chest: Lung bases are clear. Hepatobiliary: 2.0 cm subcapsular cyst in segment 4 (series 2/image 27). Gallbladder is unremarkable. No intrahepatic or extrahepatic ductal dilatation. Pancreas: Within normal limits. Spleen: Within normal limits. Adrenals/Urinary Tract: Adrenal glands are within normal limits. Right renal cysts, measuring up to 2.2 cm in the lower pole (series 2/image 43). Left kidney is within normal limits. No  hydronephrosis. Mildly thick-walled bladder, although underdistended. Stomach/Bowel: Stomach is within normal limits. No evidence of bowel obstruction. Normal appendix (series 2/image 63). Possible mild wall thickening involving the ascending colon (series 2/image 54), although equivocal. Transverse and proximal descending colon are normal. However, there is wall thickening involving the rectosigmoid colon with an associated 7.9 x 6.9 x 6.9 cm pelvic abscess (series 2/image 77). This distorts the adjacent loops of colon and likely communicates with one of the loops, although the direct communication is not well visualized the current CT. Vascular/Lymphatic: No evidence  of abdominal aortic aneurysm. Atherosclerotic calcifications of the abdominal aorta and branch vessels. No suspicious abdominopelvic lymphadenopathy. Reproductive: Prostate is unremarkable. Other: Mild lower retroperitoneal/pelvic stranding. No free air. Musculoskeletal: Visualized osseous structures are within normal limits. IMPRESSION: Suspected 7.9 cm pelvic abscess with adjacent rectosigmoid colonic wall thickening, likely related to infectious/inflammatory colitis. No associated free air. These results were called by telephone at the time of interpretation on 11/14/2020 at 6:00 am to provider [facility] , who verbally acknowledged these results. Electronically Signed   By: Charline Bills M.D.   On: 11/14/2020 06:20   CT IMAGE GUIDED FLUID DRAIN BY CATHETER  Result Date: 11/16/2020 INDICATION: Pericolonic abscess. Please perform CT-guided percutaneous drainage catheter placement for infection source control purposes. EXAM: CT IMAGE GUIDED FLUID DRAIN BY CATHETER COMPARISON:  CT abdomen and pelvis-11/14/2020 MEDICATIONS: The patient is currently admitted to the hospital and receiving intravenous antibiotics. The antibiotics were administered within an appropriate time frame prior to the initiation of the procedure. ANESTHESIA/SEDATION:  Moderate (conscious) sedation was employed during this procedure. A total of Versed 4 mg, Benadryl 25 mg and Fentanyl 100 mcg was administered intravenously. Moderate Sedation Time: 19 minutes. The patient's level of consciousness and vital signs were monitored continuously by radiology nursing throughout the procedure under my direct supervision. CONTRAST:  None COMPLICATIONS: None immediate. PROCEDURE: Informed written consent was obtained from the patient after a discussion of the risks, benefits and alternatives to treatment. The patient was placed prone on the CT gantry and a pre procedural CT was performed demonstrating slight decrease in size of the known abscess/fluid collection within the midline of the pelvis with dominant component measuring approximately 5.0 x 3.4 cm (image 20, series 2), previously, 7.9 x 6.9 cm. The procedure was planned. A timeout was performed prior to the initiation of the procedure. The skin overlying the left buttocks was prepped and draped in the usual sterile fashion. The overlying soft tissues were anesthetized with 1% lidocaine with epinephrine. Appropriate trajectory was planned with the use of a 22 gauge spinal needle. An 18 gauge trocar needle was advanced into the abscess/fluid collection and a short Amplatz super stiff wire was coiled within the collection. Appropriate positioning was confirmed with a limited CT scan. The tract was serially dilated allowing placement of a 10 [place] all-purpose drainage catheter. Appropriate positioning was confirmed with a limited postprocedural CT scan. Approximately 40 ml of purulent fluid was aspirated. The tube was connected to a drainage bag and sutured in place. A dressing was placed. The patient tolerated the procedure well without immediate post procedural complication. IMPRESSION: Successful CT guided placement of a 10 French all purpose drain catheter into the pelvic abscess with aspiration of 40 mL of purulent fluid. Samples  were sent to the laboratory as requested by the ordering clinical team. Electronically Signed   By: Simonne Come M.D.   On: 11/16/2020 08:05    Subjective: Seen and examined at bedside and denied any pain.  Only had some incisional discomfort from where his catheter was.  No nausea or vomiting.  Tolerated his diet without issues.  Antibiotics were changed to p.o. and he is tolerating them well and his white blood cell count trending down.  No other concerns or complaints this time and has been deemed stable to be discharged.  Discharge Exam: Vitals:   11/17/20 2123 11/18/20 0606  BP: 132/74 140/89  Pulse: 74 70  Resp: 18 18  Temp: 99.3 F (37.4 C) 98.5 F (36.9 C)  SpO2:  95% 93%   Vitals:   11/17/20 0433 11/17/20 1436 11/17/20 2123 11/18/20 0606  BP: (!) 160/70 (!) 147/90 132/74 140/89  Pulse: 61 62 74 70  Resp: 16 18 18 18   Temp: 98.7 F (37.1 C) 98.9 F (37.2 C) 99.3 F (37.4 C) 98.5 F (36.9 C)  TempSrc: Oral Oral Oral Oral  SpO2: 99% 99% 95% 93%  Weight:      Height:       General: Pt is alert, awake, not in acute distress Cardiovascular: RRR, S1/S2 +, no rubs, no gallops Respiratory: CTA bilaterally, no wheezing, no rhonchi Abdominal: Soft, NT, ND, bowel sounds + Extremities: no edema, no cyanosis; Has a Drain in the Left buttock  The results of significant diagnostics from this hospitalization (including imaging, microbiology, ancillary and laboratory) are listed below for reference.    Microbiology: Recent Results (from the past 240 hour(s))  Resp Panel by RT-PCR (Flu A&B, Covid)     Status: None   Collection Time: 11/14/20  6:18 AM   Specimen: Nasopharyngeal(NP) swabs in vial transport medium  Result Value Ref Range Status   SARS Coronavirus 2 by RT PCR NEGATIVE NEGATIVE Final    Comment: (NOTE) SARS-CoV-2 target nucleic acids are NOT DETECTED.  The SARS-CoV-2 RNA is generally detectable in upper respiratory specimens during the acute phase of infection. The  lowest concentration of SARS-CoV-2 viral copies this assay can detect is 138 copies/mL. A negative result does not preclude SARS-Cov-2 infection and should not be used as the sole basis for treatment or other patient management decisions. A negative result may occur with  improper specimen collection/handling, submission of specimen other than nasopharyngeal swab, presence of viral mutation(s) within the areas targeted by this assay, and inadequate number of viral copies(<138 copies/mL). A negative result must be combined with clinical observations, patient history, and epidemiological information. The expected result is Negative.  Fact Sheet for Patients:  11/16/20  Fact Sheet for Healthcare Providers:  BloggerCourse.com  This test is no t yet approved or cleared by the [employer].[place] FDA and  has been authorized for detection and/or diagnosis of SARS-CoV-2 by FDA under an Emergency Use Authorization (EUA). This EUA will remain  in effect (meaning this test can be used) for the duration of the COVID-19 declaration under Section 564(b)(1) of the Act, 21 U.S.C.section 360bbb-3(b)(1), unless the authorization is terminated  or revoked sooner.       Influenza A by PCR NEGATIVE NEGATIVE Final   Influenza B by PCR NEGATIVE NEGATIVE Final    Comment: (NOTE) The Xpert Xpress SARS-CoV-2/FLU/RSV plus assay is intended as an aid in the diagnosis of influenza from Nasopharyngeal swab specimens and should not be used as a sole basis for treatment. Nasal washings and aspirates are unacceptable for Xpert Xpress SARS-CoV-2/FLU/RSV testing.  Fact Sheet for Patients: [place]  Fact Sheet for Healthcare Providers: BloggerCourse.com  This test is not yet approved or cleared by the [employer]ious[employer]ker.[employer] FDA and has been authorized for detection and/or diagnosis of SARS-CoV-2 by FDA under  an Emergency Use Authorization (EUA). This EUA will remain in effect (meaning this test can be used) for the duration of the COVID-19 declaration under Section 564(b)(1) of the Act, 21 U.S.C. section 360bbb-3(b)(1), unless the authorization is terminated or revoked.  Performed at [facility], [address] [place], [place]   Gastrointestinal Panel by PCR , Stool     Status: None   Collection Time: 11/14/20  6:28 PM  Specimen: STOOL  Result Value Ref Range Status   Campylobacter species NOT DETECTED NOT DETECTED Final   Plesimonas shigelloides NOT DETECTED NOT DETECTED Final   Salmonella species NOT DETECTED NOT DETECTED Final   Yersinia enterocolitica NOT DETECTED NOT DETECTED Final   Vibrio species NOT DETECTED NOT DETECTED Final   Vibrio cholerae NOT DETECTED NOT DETECTED Final   Enteroaggregative E coli (EAEC) NOT DETECTED NOT DETECTED Final   Enteropathogenic E coli (EPEC) NOT DETECTED NOT DETECTED Final   Enterotoxigenic E coli (ETEC) NOT DETECTED NOT DETECTED Final   Shiga like toxin producing E coli (STEC) NOT DETECTED NOT DETECTED Final   Shigella/Enteroinvasive E coli (EIEC) NOT DETECTED NOT DETECTED Final   Cryptosporidium NOT DETECTED NOT DETECTED Final   Cyclospora cayetanensis NOT DETECTED NOT DETECTED Final   Entamoeba histolytica NOT DETECTED NOT DETECTED Final   Giardia lamblia NOT DETECTED NOT DETECTED Final   Adenovirus F40/41 NOT DETECTED NOT DETECTED Final   Astrovirus NOT DETECTED NOT DETECTED Final   Norovirus GI/GII NOT DETECTED NOT DETECTED Final   Rotavirus A NOT DETECTED NOT DETECTED Final   Sapovirus (I, II, IV, and V) NOT DETECTED NOT DETECTED Final    Comment: Performed at [facility], [address] [place], [place] [address]  C Difficile Quick Screen w PCR reflex     Status: None   Collection Time: 11/14/20  6:28 PM   Specimen: STOOL  Result Value Ref Range Status   C Diff antigen NEGATIVE NEGATIVE Final   C  Diff toxin NEGATIVE NEGATIVE Final   C Diff interpretation No C. difficile detected.  Final    Comment: Performed at [facility], [address], [place], [place] [address]  Aerobic/Anaerobic Culture (surgical/deep wound)     Status: None (Preliminary result)   Collection Time: 11/15/20 12:26 PM   Specimen: Abscess  Result Value Ref Range Status   Specimen Description   Final    ABSCESS LEFT TRANSGLUTEAL DRAIN PLACEMENT Performed at [facility], [address] [place], [place] [address]    Special Requests   Final    NONE Performed at [facility], [address] [place], [place] [address]    Gram Stain   Final    ABUNDANT WBC PRESENT, PREDOMINANTLY PMN ABUNDANT GRAM POSITIVE COCCI MODERATE GRAM NEGATIVE RODS    Culture   Final    MODERATE ESCHERICHIA COLI ABUNDANT BACTEROIDES THETAIOTAOMICRON BETA LACTAMASE POSITIVE CULTURE REINCUBATED FOR BETTER GROWTH Performed at [facility], [address], [place], [place] [address]    Report Status PENDING  Incomplete   Organism ID, Bacteria ESCHERICHIA COLI  Final      Susceptibility   Escherichia coli - MIC*    AMPICILLIN >=32 RESISTANT Resistant     CEFAZOLIN <=4 SENSITIVE Sensitive     CEFEPIME <=0.12 SENSITIVE Sensitive     CEFTAZIDIME <=1 SENSITIVE Sensitive     CEFTRIAXONE <=0.25 SENSITIVE Sensitive     CIPROFLOXACIN <=0.25 SENSITIVE Sensitive     GENTAMICIN <=1 SENSITIVE Sensitive     IMIPENEM <=0.25 SENSITIVE Sensitive     TRIMETH/SULFA <=20 SENSITIVE Sensitive     AMPICILLIN/SULBACTAM 8 SENSITIVE Sensitive     PIP/TAZO <=4 SENSITIVE Sensitive     * MODERATE ESCHERICHIA COLI    Labs: BNP (last 3 results) No results for input(s): BNP in the last 8760 hours. Basic Metabolic Panel: Recent Labs  Lab 11/14/20 0410 11/14/20 1434 11/15/20 0453 11/17/20 1308 11/18/20 0425  NA 137  --  140 140 138  K 2.7*  --  3.8 3.4* 3.6  CL 100  --  107 102 100  CO2 26  --  27  29 28   GLUCOSE 155*  --  107* 100* 107*  BUN 8  --  10 10 10   CREATININE 0.86  --  1.05 1.04 0.98  CALCIUM 8.5*  --  8.6* 8.9 9.1  MG  --  1.8  --  2.0 2.1  PHOS  --   --   --  3.5 3.0   Liver Function Tests: Recent Labs  Lab 11/14/20 0410 11/15/20 0453 11/17/20 0834 11/18/20 0425  AST 17 15 19  35  ALT 27 24 25 31   ALKPHOS 65 89 56 55  BILITOT 0.4 0.5 0.1* 0.6  PROT 7.8 6.9 7.2 7.4  ALBUMIN 3.4* 2.8* 3.0* 3.1*   No results for input(s): LIPASE, AMYLASE in the last 168 hours. No results for input(s): AMMONIA in the last 168 hours. CBC: Recent Labs  Lab 11/14/20 0410 11/15/20 0453 11/16/20 1219 11/17/20 0426 11/18/20 0425  WBC 20.6* 17.1* 14.6* 11.9* 10.5  NEUTROABS 17.1*  --   --   --  7.3  HGB 12.8* 11.2* 12.1* 11.2* 12.4*  HCT 38.2* 34.1* 36.7* 34.1* 37.6*  MCV 87.8 89.5 90.0 88.8 88.5  PLT 327 296 344 338 379   Cardiac Enzymes: No results for input(s): CKTOTAL, CKMB, CKMBINDEX, TROPONINI in the last 168 hours. BNP: Invalid input(s): POCBNP CBG: No results for input(s): GLUCAP in the last 168 hours. D-Dimer No results for input(s): DDIMER in the last 72 hours. Hgb A1c No results for input(s): HGBA1C in the last 72 hours. Lipid Profile No results for input(s): CHOL, HDL, LDLCALC, TRIG, CHOLHDL, LDLDIRECT in the last 72 hours. Thyroid function studies No results for input(s): TSH, T4TOTAL, T3FREE, THYROIDAB in the last 72 hours.  Invalid input(s): FREET3 Anemia work up No results for input(s): VITAMINB12, FOLATE, FERRITIN, TIBC, IRON, RETICCTPCT in the last 72 hours. Urinalysis    Component Value Date/Time   COLORURINE YELLOW 11/14/2020 0630   APPEARANCEUR CLEAR 11/14/2020 0630   LABSPEC <1.005 (L) 11/14/2020 0630   PHURINE 6.0 11/14/2020 0630   GLUCOSEU NEGATIVE 11/14/2020 0630   HGBUR SMALL (A) 11/14/2020 0630   BILIRUBINUR NEGATIVE 11/14/2020 0630   KETONESUR NEGATIVE 11/14/2020 0630   PROTEINUR NEGATIVE 11/14/2020 0630   NITRITE NEGATIVE  11/14/2020 0630   LEUKOCYTESUR NEGATIVE 11/14/2020 0630   Sepsis Labs Invalid input(s): PROCALCITONIN,  WBC,  LACTICIDVEN Microbiology Recent Results (from the past 240 hour(s))  Resp Panel by RT-PCR (Flu A&B, Covid)     Status: None   Collection Time: 11/14/20  6:18 AM   Specimen: Nasopharyngeal(NP) swabs in vial transport medium  Result Value Ref Range Status   SARS Coronavirus 2 by RT PCR NEGATIVE NEGATIVE Final    Comment: (NOTE) SARS-CoV-2 target nucleic acids are NOT DETECTED.  The SARS-CoV-2 RNA is generally detectable in upper respiratory specimens during the acute phase of infection. The lowest concentration of SARS-CoV-2 viral copies this assay can detect is 138 copies/mL. A negative result does not preclude SARS-Cov-2 infection and should not be used as the sole basis for treatment or other patient management decisions. A negative result may occur with  improper specimen collection/handling, submission of specimen other than nasopharyngeal swab, presence of viral mutation(s) within the areas targeted by this assay, and inadequate number of viral copies(<138 copies/mL). A negative result must be combined with clinical observations, patient  history, and epidemiological information. The expected result is Negative.  Fact Sheet for Patients:  BloggerCourse.com  Fact Sheet for Healthcare Providers:  SeriousBroker.it  This test is no t yet approved or cleared by the [place] FDA and  has been authorized for detection and/or diagnosis of SARS-CoV-2 by FDA under an Emergency Use Authorization (EUA). This EUA will remain  in effect (meaning this test can be used) for the duration of the COVID-19 declaration under Section 564(b)(1) of the Act, 21 U.S.C.section 360bbb-3(b)(1), unless the authorization is terminated  or revoked sooner.       Influenza A by PCR NEGATIVE NEGATIVE Final   Influenza B by PCR NEGATIVE  NEGATIVE Final    Comment: (NOTE) The Xpert Xpress SARS-CoV-2/FLU/RSV plus assay is intended as an aid in the diagnosis of influenza from Nasopharyngeal swab specimens and should not be used as a sole basis for treatment. Nasal washings and aspirates are unacceptable for Xpert Xpress SARS-CoV-2/FLU/RSV testing.  Fact Sheet for Patients: BloggerCourse.com  Fact Sheet for Healthcare Providers: SeriousBroker.it  This test is not yet approved or cleared by the [place] FDA and has been authorized for detection and/or diagnosis of SARS-CoV-2 by FDA under an Emergency Use Authorization (EUA). This EUA will remain in effect (meaning this test can be used) for the duration of the COVID-19 declaration under Section 564(b)(1) of the Act, 21 U.S.C. section 360bbb-3(b)(1), unless the authorization is terminated or revoked.  Performed at [facility], [address] [place], [place] [address]   Gastrointestinal Panel by PCR , Stool     Status: None   Collection Time: 11/14/20  6:28 PM   Specimen: STOOL  Result Value Ref Range Status   Campylobacter species NOT DETECTED NOT DETECTED Final   Plesimonas shigelloides NOT DETECTED NOT DETECTED Final   Salmonella species NOT DETECTED NOT DETECTED Final   Yersinia enterocolitica NOT DETECTED NOT DETECTED Final   Vibrio species NOT DETECTED NOT DETECTED Final   Vibrio cholerae NOT DETECTED NOT DETECTED Final   Enteroaggregative E coli (EAEC) NOT DETECTED NOT DETECTED Final   Enteropathogenic E coli (EPEC) NOT DETECTED NOT DETECTED Final   Enterotoxigenic E coli (ETEC) NOT DETECTED NOT DETECTED Final   Shiga like toxin producing E coli (STEC) NOT DETECTED NOT DETECTED Final   Shigella/Enteroinvasive E coli (EIEC) NOT DETECTED NOT DETECTED Final   Cryptosporidium NOT DETECTED NOT DETECTED Final   Cyclospora cayetanensis NOT DETECTED NOT DETECTED Final   Entamoeba histolytica NOT  DETECTED NOT DETECTED Final   Giardia lamblia NOT DETECTED NOT DETECTED Final   Adenovirus F40/41 NOT DETECTED NOT DETECTED Final   Astrovirus NOT DETECTED NOT DETECTED Final   Norovirus GI/GII NOT DETECTED NOT DETECTED Final   Rotavirus A NOT DETECTED NOT DETECTED Final   Sapovirus (I, II, IV, and V) NOT DETECTED NOT DETECTED Final    Comment: Performed at [facility], [address] [place], [place] [address]  C Difficile Quick Screen w PCR reflex     Status: None   Collection Time: 11/14/20  6:28 PM   Specimen: STOOL  Result Value Ref Range Status   C Diff antigen NEGATIVE NEGATIVE Final   C Diff toxin NEGATIVE NEGATIVE Final   C Diff interpretation No C. difficile detected.  Final    Comment: Performed at [facility], [address] [place], [place] [address]  Aerobic/Anaerobic Culture (surgical/deep wound)     Status: None (Preliminary result)   Collection Time: 11/15/20  12:26 PM   Specimen: Abscess  Result Value Ref Range Status   Specimen Description   Final    ABSCESS LEFT TRANSGLUTEAL DRAIN PLACEMENT Performed at [facility], [address], [place], [place] [address]    Special Requests   Final    NONE Performed at [facility], [address], [place], [place] [address]    Gram Stain   Final    ABUNDANT WBC PRESENT, PREDOMINANTLY PMN ABUNDANT GRAM POSITIVE COCCI MODERATE GRAM NEGATIVE RODS    Culture   Final    MODERATE ESCHERICHIA COLI ABUNDANT BACTEROIDES THETAIOTAOMICRON BETA LACTAMASE POSITIVE CULTURE REINCUBATED FOR BETTER GROWTH Performed at [facility], [address] [place], [place] [address]    Report Status PENDING  Incomplete   Organism ID, Bacteria ESCHERICHIA COLI  Final      Susceptibility   Escherichia coli - MIC*    AMPICILLIN >=32 RESISTANT Resistant     CEFAZOLIN <=4 SENSITIVE Sensitive     CEFEPIME <=0.12 SENSITIVE Sensitive     CEFTAZIDIME <=1 SENSITIVE Sensitive      CEFTRIAXONE <=0.25 SENSITIVE Sensitive     CIPROFLOXACIN <=0.25 SENSITIVE Sensitive     GENTAMICIN <=1 SENSITIVE Sensitive     IMIPENEM <=0.25 SENSITIVE Sensitive     TRIMETH/SULFA <=20 SENSITIVE Sensitive     AMPICILLIN/SULBACTAM 8 SENSITIVE Sensitive     PIP/TAZO <=4 SENSITIVE Sensitive     * MODERATE ESCHERICHIA COLI   Time coordinating discharge: 35 minutes  SIGNED:  Merlene Laughter, DO [facility] 11/18/2020, 12:28 PM Pager is on AMION  If 7PM-7AM, please contact night-coverage [email or web address]

## 2020-11-18 NOTE — Progress Notes (Signed)
Referring Physician(s): Teddy Spike [facility])  Supervising Physician: Richarda Overlie  Patient Status:  [facility] - In-pt  Chief Complaint:  History of pericolonic abscess s/p left TG drain placement in IR 11/15/2020.  Subjective:  Patient awake and alert laying in bed watching TV talking on phone. Complains of soreness of left TG drain insertion site, as expected given location. States drain was just emptied prior to my arrival. Left TG drain site c/d/i.   Allergies: Patient has no known allergies.  Medications: Prior to Admission medications   Medication Sig Start Date End Date Taking? Authorizing Provider  dicyclomine (BENTYL) 20 MG tablet Take 1 tablet (20 mg total) by mouth 4 (four) times daily -  before meals and at bedtime. 11/12/20  Yes Wieters, Hallie C, PA-C  ibuprofen (ADVIL) 200 MG tablet Take 400-600 mg by mouth every 6 (six) hours as needed for fever, headache or mild pain.   Yes [provider]     Vital Signs: BP 140/89 (BP Location: Right Arm)   Pulse 70   Temp 98.5 F (36.9 C) (Oral)   Resp 18   Ht 5\' 9"  (1.753 m)   Wt 165 lb (74.8 kg)   SpO2 93%   BMI 24.37 kg/m   Physical Exam Vitals and nursing note reviewed.  Constitutional:      General: He is not in acute distress. Pulmonary:     Effort: Pulmonary effort is normal. No respiratory distress.  Abdominal:     Comments: Left TG drain site with mild tenderness and minimal dried drainage on bandage, no erythema or active bleeding/drainage noted; minimal output of thick yellow fluid in gravity bag; drain flushes/aspirates without resistance with outflow of fluid into gravity bag following this.  Skin:    General: Skin is warm and dry.  Neurological:     Mental Status: He is alert and oriented to person, place, and time.     Imaging: CT IMAGE GUIDED FLUID DRAIN BY CATHETER  Result Date: 11/16/2020 INDICATION: Pericolonic abscess. Please perform CT-guided percutaneous drainage catheter  placement for infection source control purposes. EXAM: CT IMAGE GUIDED FLUID DRAIN BY CATHETER COMPARISON:  CT abdomen and pelvis-11/14/2020 MEDICATIONS: The patient is currently admitted to the hospital and receiving intravenous antibiotics. The antibiotics were administered within an appropriate time frame prior to the initiation of the procedure. ANESTHESIA/SEDATION: Moderate (conscious) sedation was employed during this procedure. A total of Versed 4 mg, Benadryl 25 mg and Fentanyl 100 mcg was administered intravenously. Moderate Sedation Time: 19 minutes. The patient's level of consciousness and vital signs were monitored continuously by radiology nursing throughout the procedure under my direct supervision. CONTRAST:  None COMPLICATIONS: None immediate. PROCEDURE: Informed written consent was obtained from the patient after a discussion of the risks, benefits and alternatives to treatment. The patient was placed prone on the CT gantry and a pre procedural CT was performed demonstrating slight decrease in size of the known abscess/fluid collection within the midline of the pelvis with dominant component measuring approximately 5.0 x 3.4 cm (image 20, series 2), previously, 7.9 x 6.9 cm. The procedure was planned. A timeout was performed prior to the initiation of the procedure. The skin overlying the left buttocks was prepped and draped in the usual sterile fashion. The overlying soft tissues were anesthetized with 1% lidocaine with epinephrine. Appropriate trajectory was planned with the use of a 22 gauge spinal needle. An 18 gauge trocar needle was advanced into the abscess/fluid collection and a short Amplatz super  stiff wire was coiled within the collection. Appropriate positioning was confirmed with a limited CT scan. The tract was serially dilated allowing placement of a 10 [place] all-purpose drainage catheter. Appropriate positioning was confirmed with a limited postprocedural CT scan. Approximately 40  ml of purulent fluid was aspirated. The tube was connected to a drainage bag and sutured in place. A dressing was placed. The patient tolerated the procedure well without immediate post procedural complication. IMPRESSION: Successful CT guided placement of a 10 French all purpose drain catheter into the pelvic abscess with aspiration of 40 mL of purulent fluid. Samples were sent to the laboratory as requested by the ordering clinical team. Electronically Signed   By: Simonne Come M.D.   On: 11/16/2020 08:05    Labs:  CBC: Recent Labs    11/15/20 0453 11/16/20 1219 11/17/20 0426 11/18/20 0425  WBC 17.1* 14.6* 11.9* 10.5  HGB 11.2* 12.1* 11.2* 12.4*  HCT 34.1* 36.7* 34.1* 37.6*  PLT 296 344 338 379    COAGS: No results for input(s): INR, APTT in the last 8760 hours.  BMP: Recent Labs    11/14/20 0410 11/15/20 0453 11/17/20 0834 11/18/20 0425  NA 137 140 140 138  K 2.7* 3.8 3.4* 3.6  CL 100 107 102 100  CO2 26 27 29 28   GLUCOSE 155* 107* 100* 107*  BUN 8 10 10 10   CALCIUM 8.5* 8.6* 8.9 9.1  CREATININE 0.86 1.05 1.04 0.98  GFRNONAA >60 >60 >60 >60    LIVER FUNCTION TESTS: Recent Labs    11/14/20 0410 11/15/20 0453 11/17/20 0834 11/18/20 0425  BILITOT 0.4 0.5 0.1* 0.6  AST 17 15 19  35  ALT 27 24 25 31   ALKPHOS 65 89 56 55  PROT 7.8 6.9 7.2 7.4  ALBUMIN 3.4* 2.8* 3.0* 3.1*    Assessment and Plan:  History of pericolonic abscess s/p left TG drain placement in IR 11/15/2020. Left TG drain stable with minimal output (was just emptied prior to my arrival) of thick yellow fluid in gravity bag (additional 110 cc output from drain in past 24 hours per chart). Continue current drain management- continue with Qshift flushes/monitor of output. Plan for repeat CT/possible drain injection when output <10 cc/day (assess for possible removal). Further plans per TRH/CCS- appreciate and agree with management. IR to follow.   Electronically Signed: 01/18/21,  PA-C 11/18/2020, 9:18 AM   I spent a total of 15 Minutes at the the patient's bedside AND on the patient's hospital floor or unit, greater than 50% of which was counseling/coordinating care for pelvic abscess s/p left TG drain placement.

## 2020-11-18 NOTE — Progress Notes (Signed)
Progress Note     Subjective: CC: afebrile, normal BP. He has no complaints this am. Tolerating diet and had BM this AM. No nausea/emesis. Pain well controlled  Objective: Vital signs in last 24 hours: Temp:  [98.5 F (36.9 C)-99.3 F (37.4 C)] 98.5 F (36.9 C) (06/02 0606) Pulse Rate:  [62-74] 70 (06/02 0606) Resp:  [18] 18 (06/02 0606) BP: (132-147)/(74-90) 140/89 (06/02 0606) SpO2:  [93 %-99 %] 93 % (06/02 0606) Last BM Date: 11/17/20  Intake/Output from previous day: 06/01 0701 - 06/02 0700 In: 485 [P.O.:480] Out: 110 [Drains:110] Intake/Output this shift: No intake/output data recorded.  PE: General: pleasant, WD, male who is laying in bed in NAD HEENT: head is normocephalic, atraumatic. Mouth is pink and moist Heart: Palpable radial pulses bilaterally Lungs: Respiratory effort nonlabored Abd: soft, NT, ND, +BS. Drain with brown output MS: all 4 extremities are symmetrical with no cyanosis, clubbing, or edema. Skin: warm and dry with no masses, lesions, or rashes Psych: A&Ox3 with an appropriate affect.    Lab Results:  Recent Labs    11/17/20 0426 11/18/20 0425  WBC 11.9* 10.5  HGB 11.2* 12.4*  HCT 34.1* 37.6*  PLT 338 379   BMET Recent Labs    11/17/20 0834 11/18/20 0425  NA 140 138  K 3.4* 3.6  CL 102 100  CO2 29 28  GLUCOSE 100* 107*  BUN 10 10  CREATININE 1.04 0.98  CALCIUM 8.9 9.1   PT/INR No results for input(s): LABPROT, INR in the last 72 hours. CMP     Component Value Date/Time   NA 138 11/18/2020 0425   K 3.6 11/18/2020 0425   CL 100 11/18/2020 0425   CO2 28 11/18/2020 0425   GLUCOSE 107 (H) 11/18/2020 0425   BUN 10 11/18/2020 0425   CREATININE 0.98 11/18/2020 0425   CALCIUM 9.1 11/18/2020 0425   PROT 7.4 11/18/2020 0425   ALBUMIN 3.1 (L) 11/18/2020 0425   AST 35 11/18/2020 0425   ALT 31 11/18/2020 0425   ALKPHOS 55 11/18/2020 0425   BILITOT 0.6 11/18/2020 0425   GFRNONAA >60 11/18/2020 0425   Lipase  No results  found for: LIPASE     Studies/Results: No results found.  Anti-infectives: Anti-infectives (From admission, onward)   Start     Dose/Rate Route Frequency Ordered Stop   11/17/20 1600  metroNIDAZOLE (FLAGYL) tablet 500 mg        500 mg Oral Every 8 hours 11/17/20 0903 11/21/20 2359   11/17/20 1400  ciprofloxacin (CIPRO) tablet 500 mg        500 mg Oral 2 times daily 11/17/20 0903 11/21/20 2359   11/14/20 1600  metroNIDAZOLE (FLAGYL) IVPB 500 mg  Status:  Discontinued        500 mg 100 mL/hr over 60 Minutes Intravenous Every 8 hours 11/14/20 1407 11/17/20 0903   11/14/20 1500  ceFEPIme (MAXIPIME) 2 g in sodium chloride 0.9 % 100 mL IVPB  Status:  Discontinued        2 g 200 mL/hr over 30 Minutes Intravenous Every 8 hours 11/14/20 1414 11/17/20 0903   11/14/20 0615  ceFEPIme (MAXIPIME) 2 g in sodium chloride 0.9 % 100 mL IVPB       "And" Linked Group Details   2 g 200 mL/hr over 30 Minutes Intravenous  Once 11/14/20 0610 11/14/20 0701   11/14/20 0615  metroNIDAZOLE (FLAGYL) IVPB 500 mg       "And" Linked Group Details   500  mg 100 mL/hr over 60 Minutes Intravenous  Once 11/14/20 0610 11/14/20 3704       Assessment/Plan 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 Coli sensitive to cipro - WBC normal following transition to PO abx - end date 6/4 to complete 5 day course - tolerating soft diet - could advance to regular but he still has low appetite and he prefers soft - ambulate  FEN: soft  ID: cefepime 5/28>6/1 flagyl IV 5/29>6/1, cipro PO 6/1>, flagyl PO 6/1 VTE: SCDs, okay for pharmaceutical prophylaxis from surgical standpoint if indicated  Disposition: anaerobic portion of culture pending (talked to lab and will likely result tomorrow)  but from our standpoint probably okay to go home today - will confirm with MD  Follow up with IR for drain  Will need follow up with GI in the next 6-8 weeks for colonoscopy  which I discussed with him   LOS: 3 days    Eric Form, [facility] 11/18/2020, 9:09 AM Please see Amion for pager number during day hours 7:00am-4:30pm

## 2020-11-18 NOTE — Progress Notes (Signed)
Assessment unchanged. Dressing changed at drain site and taught pt who verbalized understanding. Pt verbalized understanding of dc instruction through teach back including meds and follow up care. Discharged to front entrance accompanied by son and NT.

## 2020-11-19 ENCOUNTER — Other Ambulatory Visit: Payer: Self-pay | Admitting: General Surgery

## 2020-11-19 DIAGNOSIS — K651 Peritoneal abscess: Secondary | ICD-10-CM

## 2020-11-23 LAB — AEROBIC/ANAEROBIC CULTURE W GRAM STAIN (SURGICAL/DEEP WOUND)

## 2020-11-30 ENCOUNTER — Ambulatory Visit
Admission: RE | Admit: 2020-11-30 | Discharge: 2020-11-30 | Disposition: A | Discharge status: Home / Self Care | Payer: 59 | Source: Ambulatory Visit | Attending: General Surgery | Admitting: General Surgery

## 2020-11-30 ENCOUNTER — Ambulatory Visit
Admission: RE | Admit: 2020-11-30 | Discharge: 2020-11-30 | Disposition: A | Discharge status: Home / Self Care | Payer: 59 | Source: Ambulatory Visit | Attending: Radiology | Admitting: Radiology

## 2020-11-30 ENCOUNTER — Encounter: Payer: Self-pay | Admitting: *Deleted

## 2020-11-30 DIAGNOSIS — K651 Peritoneal abscess: Secondary | ICD-10-CM

## 2020-11-30 HISTORY — PX: IR RADIOLOGIST EVAL & MGMT: IMG5224

## 2020-11-30 MED ORDER — IOPAMIDOL (ISOVUE-300) INJECTION 61%
100.0000 mL | Freq: Once | INTRAVENOUS | Status: AC | PRN
  Administered 2020-11-30: 100 mL via INTRAVENOUS

## 2020-11-30 NOTE — Progress Notes (Signed)
Referring Physician(s): Dr. Clovis Pu  Chief Complaint: The patient is seen in follow up today s/p  pericolonic abscess drain placed on 5.30.22  History of present illness: 45 y.o. male outpatient. No pertinent medical history. Presented to the ED at [facility]  on 5.29.22 with persistent and worsening pelvic pain x 1 week with low grade fever. Patient was seen at an urgent care on 5.28.22 and prescribed bentyl with no improvement in pain.  Found to have a pericolonic abscess. On 5.30.22 IR placed an 10 Fr abscess drain to the pericolonic abscess with 40 ml of purulent fluid. Cultures grew e. coli.  He was discharged on ciprofloxacin and Flagyl. Patient is scheduled to follow up with general surgery on 6.27.22.    No past medical history on file.  No past surgical history on file.  Allergies: Patient has no known allergies.  Medications: Prior to Admission medications   Medication Sig Start Date End Date Taking? Authorizing Provider  acetaminophen (TYLENOL) 325 MG tablet Take 2 tablets (650 mg total) by mouth every 6 (six) hours as needed for fever, mild pain or headache. 11/18/20   Marguerita Merles Latif, DO  dicyclomine (BENTYL) 20 MG tablet Take 1 tablet (20 mg total) by mouth 4 (four) times daily -  before meals and at bedtime. 11/12/20   Wieters, Hallie C, PA-C  ibuprofen (ADVIL) 200 MG tablet Take 400-600 mg by mouth every 6 (six) hours as needed for fever, headache or mild pain.    [provider]  ondansetron (ZOFRAN) 4 MG tablet Take 1 tablet (4 mg total) by mouth every 6 (six) hours as needed for nausea. 11/18/20   Marguerita Merles Latif, DO  oxyCODONE (OXY IR/ROXICODONE) 5 MG immediate release tablet Take 1 tablet (5 mg total) by mouth every 6 (six) hours as needed for moderate pain. 11/18/20   Marguerita Merles Latif, DO  simethicone (MYLICON) 80 MG chewable tablet Chew 1 tablet (80 mg total) by mouth every 6 (six) hours as needed for flatulence. 11/18/20   Marguerita Merles Latif, DO  sodium  chloride flush (NS) 0.9 % SOLN 5 mLs by Intracatheter route every 8 (eight) hours. 11/18/20 12/18/20  Marguerita Merles Latif, DO     No family history on file.  Social History   Socioeconomic History   Marital status: Divorced    Spouse name: Not on file   Number of children: Not on file   Years of education: Not on file   Highest education level: Not on file  Occupational History   Not on file  Tobacco Use   Smoking status: Every Day    Pack years: 0.00    Types: Cigarettes   Smokeless tobacco: Never  Vaping Use   Vaping Use: Never used  Substance and Sexual Activity   Alcohol use: Yes   Drug use: Yes    Types: Marijuana   Sexual activity: Not on file  Other Topics Concern   Not on file  Social History Narrative   Not on file   Social Determinants of Health   Financial Resource Strain: Not on file  Food Insecurity: Not on file  Transportation Needs: Not on file  Physical Activity: Not on file  Stress: Not on file  Social Connections: Not on file     Vital Signs: There were no vitals taken for this visit.  Physical Exam Vitals and nursing note reviewed.  Constitutional:      Appearance: He is well-developed.  HENT:     Head:  Normocephalic.  Pulmonary:     Effort: Pulmonary effort is normal.  Abdominal:     Comments: Positive  drain to  gravity bag. Site is unremarkable with no erythema, edema, tenderness, bleeding or drainage noted at exit site. Suture and stat lock in place. Dressing is clean dry and intact. Scant  tan colored fluid noted in gravity bag. Drain is able to be flushed easily.    Musculoskeletal:        General: Normal range of motion.     Cervical back: Normal range of motion.  Skin:    General: Skin is dry.  Neurological:     Mental Status: He is alert and oriented to person, place, and time.    Imaging: No results found.  Labs:  CBC: Recent Labs    11/15/20 0453 11/16/20 1219 11/17/20 0426 11/18/20 0425  WBC 17.1* 14.6* 11.9*  10.5  HGB 11.2* 12.1* 11.2* 12.4*  HCT 34.1* 36.7* 34.1* 37.6*  PLT 296 344 338 379    COAGS: No results for input(s): INR, APTT in the last 8760 hours.  BMP: Recent Labs    11/14/20 0410 11/15/20 0453 11/17/20 0834 11/18/20 0425  NA 137 140 140 138  K 2.7* 3.8 3.4* 3.6  CL 100 107 102 100  CO2 26 27 29 28   GLUCOSE 155* 107* 100* 107*  BUN 8 10 10 10   CALCIUM 8.5* 8.6* 8.9 9.1  CREATININE 0.86 1.05 1.04 0.98  GFRNONAA >60 >60 >60 >60    LIVER FUNCTION TESTS: Recent Labs    11/14/20 0410 11/15/20 0453 11/17/20 0834 11/18/20 0425  BILITOT 0.4 0.5 0.1* 0.6  AST 17 15 19  35  ALT 27 24 25 31   ALKPHOS 65 89 56 55  PROT 7.8 6.9 7.2 7.4  ALBUMIN 3.4* 2.8* 3.0* 3.1*    Assessment:  45 y.o. male outpatient. No pertinent medical history. Presented to the ED at [facility]  on 5.29.22 with persistent and worsening pelvic pain x 1 week with low grade fever. Patient was seen at an urgent care on 5.28.22 and prescribed bentyl with no improvement in pain.  Found to have a pericolonic abscess. On 5.30.22 IR placed an 10 Fr abscess drain to the pericolonic abscess with 40 ml of purulent fluid. Cultures grew e. coli. ciprofloxacin and Flagyl. Patient is scheduled to follow up with general surgery on 6.27.22  Mr. Hisashi presents today to the IR drain clinic for pericolonic abscess drain follow up. He has completed his course of ciprofloxacin and flagyl.  He denies any fevers, abdominal pain, headache, SOB, cough, chest pain, nausea, vomiting or bleeding. He is flushing the drain 5 ml's daily and reports 0 ml's of output. Drain is to gravity drain. Scant tan colored fluid noted in the gravity device. Suture and stat lock in place. Site is unremarkable with no erythema, edema, tenderness, bleeding or drainage noted at exit site. Dressing is clean dry and intact.  CT abdomen pelvis today reveals resolution of prior abscess.Follow up drain injection shows a fistula. Images were reviewed by Dr. 03-13-1970  who  recommends drain remain in place to gravity bag at this time. Patient instructed not to flush the drain t this time.   Subsequent imaging including CT Abd pelvis  obtained on 6.14.22 reviewed by Dr. 02-13-1983. Please see attestation from Dr. 07-18-2003 regarding findings and care plan.  Patient instructed  not to flush but continue to record output and continue with gauze dressing changes every 1 to 2 days.  Patient  was scheduled for 2 week follow-up in IR drain clinic for abscessogram only  He understands to keep this appointment if the drain remains and there are no interval changes in his care    Signed: Alene Mires, NP 11/30/2020, 1:05 PM   Please refer to Dr. Miles Costain attestation of this note for management and plan.

## 2020-12-01 ENCOUNTER — Other Ambulatory Visit: Payer: Self-pay | Admitting: General Surgery

## 2020-12-01 DIAGNOSIS — K651 Peritoneal abscess: Secondary | ICD-10-CM

## 2020-12-13 NOTE — Progress Notes (Signed)
Chief Complaint: Patient was seen in consultation today for abdominal/pelvic drain follow up at the request of Thomas,Alicia  Referring Physician(s): Romie Levee  Supervising Physician: Mir, Mauri Reading  History of Present Illness: Justin Meadows is a 45 y.o. male with no significant PMHs who was admitted from 11/14/20 to 11/18/20 due to pelvic abscess, s/p drain placement with IR on 11/16/20.  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 Patient was instructed to discontinue flushing the drain, the catheter was switched from a suction bulb to a gravity bag. Patient was instructed to come back for f/u in 2 weeks.   Patient  presents today for follow-up drain injection.    Reports no complaints regarding the drain, reports OP 5-8 cc/day.  Last follow up visit with general surgery was yesterday, did not discuss much about the drain.  Patient is currently not on antibiotic treatment, patient is not flushing the drain as instructed.    No past medical history on file.  Past Surgical History:  Procedure Laterality Date   IR RADIOLOGIST EVAL & MGMT  11/30/2020    Allergies: Patient has no known allergies.  Medications: Prior to Admission medications   Medication Sig Start Date End Date Taking? Authorizing Provider  acetaminophen (TYLENOL) 325 MG tablet Take 2 tablets (650 mg total) by mouth every 6 (six) hours as needed for fever, mild pain or headache. 11/18/20   Marguerita Merles Latif, DO  dicyclomine (BENTYL) 20 MG tablet Take 1 tablet (20 mg total) by mouth 4 (four) times daily -  before meals and at bedtime. 11/12/20   Wieters, Hallie C, PA-C  ibuprofen (ADVIL) 200 MG tablet Take 400-600 mg by mouth every 6 (six) hours as needed for fever, headache or mild pain.    [provider]  ondansetron (ZOFRAN) 4 MG tablet Take 1 tablet (4 mg total) by mouth every 6 (six) hours as needed for nausea. 11/18/20   Marguerita Merles Latif, DO  oxyCODONE (OXY IR/ROXICODONE) 5 MG immediate release tablet Take 1 tablet (5 mg total) by mouth every 6 (six) hours as needed for moderate pain. 11/18/20   Marguerita Merles Latif, DO  simethicone (MYLICON) 80 MG chewable tablet Chew 1 tablet (80 mg total) by mouth every 6 (six) hours as needed for flatulence. 11/18/20   Marguerita Merles Latif, DO  sodium chloride flush (NS) 0.9 % SOLN 5 mLs by Intracatheter route every 8 (eight) hours. 11/18/20 12/18/20  Marguerita Merles Latif, DO     No family history on file.  Social History   Socioeconomic History   Marital status: Divorced    Spouse name: Not on file   Number of children: Not on file   Years of education: Not on file   Highest education level: Not on file  Occupational History   Not on file  Tobacco Use   Smoking status: Every Day    Pack years: 0.00    Types: Cigarettes   Smokeless tobacco: Never  Vaping Use   Vaping Use: Never used  Substance and Sexual Activity   Alcohol use: Yes   Drug use: Yes    Types: Marijuana   Sexual activity: Not on file  Other Topics Concern   Not on file  Social History Narrative   Not on file   Social  Determinants of Health   Financial Resource Strain: Not on file  Food Insecurity: Not on file  Transportation Needs: Not on file  Physical Activity: Not on file  Stress: Not on file  Social Connections: Not on file     Review of Systems: A 12 point ROS discussed and pertinent positives are indicated in the HPI above.  All other systems are negative.   Vital Signs: There were no vitals taken for this visit.  Physical Exam Constitutional:      General: He is not in acute distress.    Appearance: Normal appearance. He is not ill-appearing.  HENT:     Head: Normocephalic and atraumatic.  Pulmonary:     Effort: Pulmonary effort is normal.   Skin:    Comments: Positive left TG drain to a gravity bag. Site is unremarkable with no erythema, edema, tenderness, bleeding or drainage. Suture and stat lock in place.   Neurological:     Mental Status: He is alert and oriented to person, place, and time.  Psychiatric:        Mood and Affect: Mood normal.        Behavior: Behavior normal.        Judgment: Judgment normal.    Mallampati Score:     Imaging: CT ABDOMEN PELVIS W CONTRAST  Result Date: 11/30/2020 CLINICAL DATA:  Pelvic abscess, status post percutaneous drain 11/15/2020 EXAM: CT ABDOMEN AND PELVIS WITH CONTRAST TECHNIQUE: Multidetector CT imaging of the abdomen and pelvis was performed using the standard protocol following bolus administration of intravenous contrast. CONTRAST:  ISOVUE-300 IOPAMIDOL (ISOVUE-300) INJECTION 61% COMPARISON:  11/15/2020, 11/14/2020 FINDINGS: Lower chest: No acute abnormality. Hepatobiliary: Anterior liver subcapsular hypodense cystic lesion is actually smaller previous measuring 2 cm now measuring 0.9 cm in diameter, image 21 series 2. No other significant hepatic abnormality. Gallbladder unremarkable. Common bile duct nondilated. Pancreas: Unremarkable. No pancreatic ductal dilatation or surrounding inflammatory changes. Spleen: Normal in size without focal abnormality. Adrenals/Urinary Tract: Normal adrenal glands. Similar bilateral cortical hypodense renal cysts, largest in the left kidney midpole measuring 1.7 cm. No renal obstruction or hydronephrosis. No hydroureter or ureteral calculus. Ureters are symmetric and decompressed. Bladder unremarkable. Stomach/Bowel: Negative for bowel obstruction, significant dilatation, ileus, or free air. Normal appendix in the right lower quadrant. Significant improvement in the pelvic rectosigmoid colitis. Stable drain catheter position via a left trans gluteal approach. Resolved abscess at the drain catheter site. No new abdominopelvic fluid collections,  free fluid, ascites, hemorrhage, or hematoma. Vascular/Lymphatic: Minor aortic atherosclerosis. Negative for aneurysm. No retroperitoneal hemorrhage or hematoma. Mesenteric and renal vasculature appear patent. No veno-occlusive process. Negative for bulky adenopathy. Reproductive: No significant finding by CT. Other: No abdominal wall hernia or abnormality. No abdominopelvic ascites. Musculoskeletal: No acute osseous finding. IMPRESSION: Resolved pelvic abscess.  Stable left transgluteal pelvic drain. Significant improvement in the pelvis rectosigmoid colitis. No new abdominopelvic fluid collection. Electronically Signed   By: Judie Petit.  Shick M.D.   On: 11/30/2020 14:36   DG Sinus/Fist Tube Chk-Non GI  Result Date: 11/30/2020 INDICATION: Pelvic abscess status post percutaneous drain EXAM: FLUOROSCOPIC PELVIC ABSCESS DRAIN INJECTION MEDICATIONS: NONE.  PATIENT IS AN OUTPATIENT ANESTHESIA/SEDATION: None. COMPLICATIONS: None immediate. PROCEDURE: Existing left trans gluteal pelvic abscess drain was injected with contrast. Fluoroscopic imaging performed. This was correlated with today's CT. Gentle injection of the abscess drain demonstrates a small patent fistula from the collapsed abscess cavity to the adjacent colon. Contrast peristalsis throughout the collapsed sigmoid. IMPRESSION: Positive exam for residual patent  fistula to the adjacent colon. Electronically Signed   By: Judie PetitM.  Shick M.D.   On: 11/30/2020 14:39   CT IMAGE GUIDED FLUID DRAIN BY CATHETER  Result Date: 11/16/2020 INDICATION: Pericolonic abscess. Please perform CT-guided percutaneous drainage catheter placement for infection source control purposes. EXAM: CT IMAGE GUIDED FLUID DRAIN BY CATHETER COMPARISON:  CT abdomen and pelvis-11/14/2020 MEDICATIONS: The patient is currently admitted to the hospital and receiving intravenous antibiotics. The antibiotics were administered within an appropriate time frame prior to the initiation of the procedure.  ANESTHESIA/SEDATION: Moderate (conscious) sedation was employed during this procedure. A total of Versed 4 mg, Benadryl 25 mg and Fentanyl 100 mcg was administered intravenously. Moderate Sedation Time: 19 minutes. The patient's level of consciousness and vital signs were monitored continuously by radiology nursing throughout the procedure under my direct supervision. CONTRAST:  None COMPLICATIONS: None immediate. PROCEDURE: Informed written consent was obtained from the patient after a discussion of the risks, benefits and alternatives to treatment. The patient was placed prone on the CT gantry and a pre procedural CT was performed demonstrating slight decrease in size of the known abscess/fluid collection within the midline of the pelvis with dominant component measuring approximately 5.0 x 3.4 cm (image 20, series 2), previously, 7.9 x 6.9 cm. The procedure was planned. A timeout was performed prior to the initiation of the procedure. The skin overlying the left buttocks was prepped and draped in the usual sterile fashion. The overlying soft tissues were anesthetized with 1% lidocaine with epinephrine. Appropriate trajectory was planned with the use of a 22 gauge spinal needle. An 18 gauge trocar needle was advanced into the abscess/fluid collection and a short Amplatz super stiff wire was coiled within the collection. Appropriate positioning was confirmed with a limited CT scan. The tract was serially dilated allowing placement of a 10 [place] all-purpose drainage catheter. Appropriate positioning was confirmed with a limited postprocedural CT scan. Approximately 40 ml of purulent fluid was aspirated. The tube was connected to a drainage bag and sutured in place. A dressing was placed. The patient tolerated the procedure well without immediate post procedural complication. IMPRESSION: Successful CT guided placement of a 10 French all purpose drain catheter into the pelvic abscess with aspiration of 40 mL of  purulent fluid. Samples were sent to the laboratory as requested by the ordering clinical team. Electronically Signed   By: Simonne ComeJohn  Watts M.D.   On: 11/16/2020 08:05   IR Radiologist Eval & Mgmt  Result Date: 11/30/2020 Please refer to notes tab for details about interventional procedure. (Op Note)   Labs:  CBC: Recent Labs    11/15/20 0453 11/16/20 1219 11/17/20 0426 11/18/20 0425  WBC 17.1* 14.6* 11.9* 10.5  HGB 11.2* 12.1* 11.2* 12.4*  HCT 34.1* 36.7* 34.1* 37.6*  PLT 296 344 338 379    COAGS: No results for input(s): INR, APTT in the last 8760 hours.  BMP: Recent Labs    11/14/20 0410 11/15/20 0453 11/17/20 0834 11/18/20 0425  NA 137 140 140 138  K 2.7* 3.8 3.4* 3.6  CL 100 107 102 100  CO2 26 27 29 28   GLUCOSE 155* 107* 100* 107*  BUN 8 10 10 10   CALCIUM 8.5* 8.6* 8.9 9.1  CREATININE 0.86 1.05 1.04 0.98  GFRNONAA >60 >60 >60 >60    LIVER FUNCTION TESTS: Recent Labs    11/14/20 0410 11/15/20 0453 11/17/20 0834 11/18/20 0425  BILITOT 0.4 0.5 0.1* 0.6  AST 17 15 19  35  ALT 27 24  25 31  ALKPHOS 65 89 56 55  PROT 7.8 6.9 7.2 7.4  ALBUMIN 3.4* 2.8* 3.0* 3.1*    TUMOR MARKERS: No results for input(s): AFPTM, CEA, CA199, CHROMGRNA in the last 8760 hours.  Assessment:  45 y.o. male with no significant PMHs who was admitted from 11/14/20 to 11/18/20 due to pelvic abscess, s/p drain placement with IR on 11/16/20.  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 Patient was instructed to discontinue flushing the drain, the catheter was switched from a suction bulb to a gravity bag. Patient was instructed to come back for f/u in 2 weeks.   Patient  presents today for follow-up drain injection.   The drain injection was reviewed by Dr. Bryn Gulling who recommends 2 week  f/u for drain injection.  Patient agreeable.   Patient instructed not to flush the drain, record daily OP.  Dressing change as needed or once a week.    Electronically Signed: Willette Brace PA-C 12/14/2020, 2:13 PM   Please refer to Dr. Roosevelt Locks attestation of this note for management and plan.

## 2020-12-14 ENCOUNTER — Other Ambulatory Visit: Payer: Self-pay | Admitting: General Surgery

## 2020-12-14 ENCOUNTER — Ambulatory Visit
Admission: RE | Admit: 2020-12-14 | Discharge: 2020-12-14 | Disposition: A | Discharge status: Home / Self Care | Payer: 59 | Source: Ambulatory Visit | Attending: General Surgery | Admitting: General Surgery

## 2020-12-14 DIAGNOSIS — K651 Peritoneal abscess: Secondary | ICD-10-CM

## 2020-12-14 HISTORY — PX: IR RADIOLOGIST EVAL & MGMT: IMG5224

## 2020-12-28 ENCOUNTER — Ambulatory Visit
Admission: RE | Admit: 2020-12-28 | Discharge: 2020-12-28 | Disposition: A | Discharge status: Home / Self Care | Payer: 59 | Source: Ambulatory Visit | Attending: General Surgery | Admitting: General Surgery

## 2020-12-28 ENCOUNTER — Encounter: Payer: Self-pay | Admitting: Radiology

## 2020-12-28 ENCOUNTER — Other Ambulatory Visit: Payer: Self-pay | Admitting: General Surgery

## 2020-12-28 DIAGNOSIS — K651 Peritoneal abscess: Secondary | ICD-10-CM

## 2020-12-28 HISTORY — PX: IR RADIOLOGIST EVAL & MGMT: IMG5224

## 2020-12-28 NOTE — Progress Notes (Signed)
Chief Complaint: Patient was seen in consultation today for abscess drain at the request of Thomas,Alicia  Referring Physician(s): Thomas,Alicia  History of Present Illness: Justin Meadows is a 45 y.o. male with no significant PMHs who was admitted from 11/14/20 to 11/18/20 due to pelvic abscess, s/p drain placement with IR on 11/16/20.  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 Patient was instructed to discontinue flushing the drain, the catheter was switched from a suction bulb to a gravity bag.   Patient has since been seen every 2 weeks with repeat drain injection.  He continues to have approximately 5-8 mL of cloudy drainage daily.  He had 1 episode of pelvic pain last week which was self-limited and has since resolved.  He denies fever, chills or other systemic symptoms.  No past medical history on file.  Past Surgical History:  Procedure Laterality Date   IR RADIOLOGIST EVAL & MGMT  11/30/2020   IR RADIOLOGIST EVAL & MGMT  12/14/2020   IR RADIOLOGIST EVAL & MGMT  12/28/2020    Allergies: Patient has no known allergies.  Medications: Prior to Admission medications   Medication Sig Start Date End Date Taking? Authorizing Provider  acetaminophen (TYLENOL) 325 MG tablet Take 2 tablets (650 mg total) by mouth every 6 (six) hours as needed for fever, mild pain or headache. 11/18/20   Marguerita Merles Latif, DO  dicyclomine (BENTYL) 20 MG tablet Take 1 tablet (20 mg total) by mouth 4 (four) times daily -  before meals and at bedtime. 11/12/20   Wieters, Hallie C, PA-C  ibuprofen (ADVIL) 200 MG tablet Take 400-600 mg by mouth every 6 (six) hours as needed for fever, headache or mild pain.    [provider]  ondansetron (ZOFRAN) 4 MG tablet Take 1 tablet (4 mg total)  by mouth every 6 (six) hours as needed for nausea. 11/18/20   Marguerita Merles Latif, DO  oxyCODONE (OXY IR/ROXICODONE) 5 MG immediate release tablet Take 1 tablet (5 mg total) by mouth every 6 (six) hours as needed for moderate pain. 11/18/20   Marguerita Merles Latif, DO  simethicone (MYLICON) 80 MG chewable tablet Chew 1 tablet (80 mg total) by mouth every 6 (six) hours as needed for flatulence. 11/18/20   Marguerita Merles Latif, DO     No family history on file.  Social History   Socioeconomic History   Marital status: Divorced    Spouse name: Not on file   Number of children: Not on file   Years of education: Not on file   Highest education level: Not on file  Occupational History   Not on file  Tobacco Use   Smoking status: Every Day    Pack years: 0.00    Types: Cigarettes   Smokeless tobacco: Never  Vaping Use   Vaping Use: Never used  Substance and Sexual Activity   Alcohol use: Yes   Drug use: Yes    Types: Marijuana   Sexual activity: Not on file  Other Topics Concern   Not on file  Social History Narrative   Not on file   Social Determinants of Health   Financial Resource Strain: Not on file  Food Insecurity: Not on file  Transportation Needs: Not on file  Physical Activity: Not on file  Stress: Not on file  Social Connections: Not on file     Review of Systems: A 12 point ROS discussed and pertinent positives are indicated in the HPI above.  All other systems are negative.  Review of Systems  Vital Signs: BP (!) 142/85   Pulse 75   Temp 99 F (37.2 C)   SpO2 99%   Physical Exam Constitutional:      General: He is not in acute distress.    Appearance: Normal appearance.  HENT:     Head: Normocephalic and atraumatic.  Eyes:     General: No scleral icterus. Pulmonary:     Effort: Pulmonary effort is normal.  Abdominal:     General: Abdomen is flat. There is no distension.     Palpations: Abdomen is soft.     Tenderness: There is no abdominal tenderness.   Skin:    General: Skin is warm and dry.     Comments: Left transgluteal drain site is clean, dry and intact.  No evidence of cellulitis or other abnormality.  Neurological:     Mental Status: He is alert and oriented to person, place, and time.  Psychiatric:        Mood and Affect: Mood normal.        Behavior: Behavior normal.      Imaging: CT ABDOMEN PELVIS W CONTRAST  Result Date: 11/30/2020 CLINICAL DATA:  Pelvic abscess, status post percutaneous drain 11/15/2020 EXAM: CT ABDOMEN AND PELVIS WITH CONTRAST TECHNIQUE: Multidetector CT imaging of the abdomen and pelvis was performed using the standard protocol following bolus administration of intravenous contrast. CONTRAST:  ISOVUE-300 IOPAMIDOL (ISOVUE-300) INJECTION 61% COMPARISON:  11/15/2020, 11/14/2020 FINDINGS: Lower chest: No acute abnormality. Hepatobiliary: Anterior liver subcapsular hypodense cystic lesion is actually smaller previous measuring 2 cm now measuring 0.9 cm in diameter, image 21 series 2. No other significant hepatic abnormality. Gallbladder unremarkable. Common bile duct nondilated. Pancreas: Unremarkable. No pancreatic ductal dilatation or surrounding inflammatory changes. Spleen: Normal in size without focal abnormality. Adrenals/Urinary Tract: Normal adrenal glands. Similar bilateral cortical hypodense renal cysts, largest in the left kidney midpole measuring 1.7 cm. No renal obstruction or hydronephrosis. No hydroureter or ureteral calculus. Ureters are symmetric and decompressed. Bladder unremarkable. Stomach/Bowel: Negative for bowel obstruction, significant dilatation, ileus, or free air. Normal appendix in the right lower quadrant. Significant improvement in the pelvic rectosigmoid colitis. Stable drain catheter position via a left trans gluteal approach. Resolved abscess at the drain catheter site. No new abdominopelvic fluid collections, free fluid, ascites, hemorrhage, or hematoma. Vascular/Lymphatic: Minor  aortic atherosclerosis. Negative for aneurysm. No retroperitoneal hemorrhage or hematoma. Mesenteric and renal vasculature appear patent. No veno-occlusive process. Negative for bulky adenopathy. Reproductive: No significant finding by CT. Other: No abdominal wall hernia or abnormality. No abdominopelvic ascites. Musculoskeletal: No acute osseous finding. IMPRESSION: Resolved pelvic abscess.  Stable left transgluteal pelvic drain. Significant improvement in the pelvis rectosigmoid colitis. No new abdominopelvic fluid collection. Electronically Signed   By: Judie Petit.  Shick M.D.   On: 11/30/2020 14:36   DG Sinus/Fist Tube Chk-Non GI  Result Date: 12/14/2020 INDICATION: 45 year old gentleman with pelvic abscess status post drain placement on 11/15/2020 returns for abscessogram. EXAM: Fluoroscopic pelvic abscessogram MEDICATIONS: The patient is currently admitted to the hospital and receiving intravenous antibiotics. The antibiotics were administered within an appropriate time frame prior to the initiation of the procedure. ANESTHESIA/SEDATION: None COMPLICATIONS: None immediate. PROCEDURE: Informed written consent was obtained from the patient after a thorough discussion of the procedural risks,  benefits and alternatives. Scout image demonstrated the pelvic drain in appropriate position. Contrast administered through the drain showed no significant residual cavity but continued communication with colon. Drain flushed and reattached to bag. IMPRESSION: Pelvic abscessogram demonstrates interval resolution of abscess cavity but persistent communication with sigmoid colon. PLAN: Return in 2 weeks for repeat abscessogram. Electronically Signed   By: Acquanetta Belling M.D.   On: 12/14/2020 15:04   DG Sinus/Fist Tube Chk-Non GI  Result Date: 11/30/2020 INDICATION: Pelvic abscess status post percutaneous drain EXAM: FLUOROSCOPIC PELVIC ABSCESS DRAIN INJECTION MEDICATIONS: NONE.  PATIENT IS AN OUTPATIENT ANESTHESIA/SEDATION: None.  COMPLICATIONS: None immediate. PROCEDURE: Existing left trans gluteal pelvic abscess drain was injected with contrast. Fluoroscopic imaging performed. This was correlated with today's CT. Gentle injection of the abscess drain demonstrates a small patent fistula from the collapsed abscess cavity to the adjacent colon. Contrast peristalsis throughout the collapsed sigmoid. 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 Electronically Signed   By: Judie Petit.  Shick M.D.   On: 11/30/2020 14:39   IR Radiologist Eval & Mgmt  Result Date: 12/28/2020 Please refer to notes tab for details about interventional procedure. (Op Note)  IR Radiologist Eval & Mgmt  Result Date: 12/14/2020 Please refer to notes tab for details about interventional procedure. (Op Note)  IR Radiologist Eval & Mgmt  Result Date: 11/30/2020 Please refer to notes tab for details about interventional procedure. (Op Note)   Labs:  CBC: Recent Labs    11/15/20 0453 11/16/20 1219 11/17/20 0426 11/18/20 0425  WBC 17.1* 14.6* 11.9* 10.5  HGB 11.2* 12.1* 11.2* 12.4*  HCT 34.1* 36.7* 34.1* 37.6*  PLT 296 344 338 379    COAGS: No results for input(s): INR, APTT in the last 8760 hours.  BMP: Recent Labs    11/14/20 0410 11/15/20 0453 11/17/20 0834 11/18/20 0425  NA 137 140 140 138  K 2.7* 3.8 3.4* 3.6  CL 100 107 102 100  CO2 26 27 29 28   GLUCOSE 155* 107* 100* 107*  BUN 8 10 10 10   CALCIUM 8.5* 8.6* 8.9 9.1  CREATININE 0.86 1.05 1.04 0.98  GFRNONAA >60 >60 >60 >60    LIVER FUNCTION TESTS: Recent Labs    11/14/20 0410 11/15/20 0453 11/17/20 0834 11/18/20 0425  BILITOT 0.4 0.5 0.1* 0.6  AST 17 15 19  35  ALT 27 24 25 31   ALKPHOS 65 89 56 55  PROT 7.8 6.9 7.2 7.4  ALBUMIN 3.4* 2.8* 3.0* 3.1*    TUMOR MARKERS: No results for input(s): AFPTM, CEA, CA199, CHROMGRNA in the last 8760 hours.  Assessment and Plan:  Persistent fistulous communication between the left transgluteal  drainage catheter and the adjacent sigmoid colon without significant interval change.  Patient reportedly has an appointment for colonoscopy prior to definitive surgical planning.  1.)  Tentative plan for return to IR clinic for repeat drain injection in 4 weeks unless scheduled for definitive surgical repair.    Electronically Signed: 01/17/21 12/28/2020, 1:50 PM   I spent a total of  15 Minutes in face to face in clinical consultation, greater than 50% of which was counseling/coordinating care for abscess drain in place with persistent fistula to sigmoid colon.

## 2021-01-25 ENCOUNTER — Ambulatory Visit
Admission: RE | Admit: 2021-01-25 | Discharge: 2021-01-25 | Disposition: A | Discharge status: Home / Self Care | Payer: 59 | Source: Ambulatory Visit | Attending: General Surgery | Admitting: General Surgery

## 2021-01-25 DIAGNOSIS — K651 Peritoneal abscess: Secondary | ICD-10-CM

## 2021-01-25 HISTORY — PX: IR RADIOLOGIST EVAL & MGMT: IMG5224

## 2021-01-25 NOTE — Progress Notes (Signed)
Chief Complaint: Patient was seen today for pelvic abscess drain follow up at the request of Thomas,Alicia  Referring Physician(s): Thomas,Alicia  Supervising Physician: Irish Lack  History of Present Illness: Justin Meadows is a 45 y.o. male with past medical history significant for pelvic abscess s/p percutaneous drain placement 11/15/20 in IR who presents to IR clinic today for drain follow up.   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 He presents today for repeat drain injection.  No complaints today regarding the drain, he states that he does sometimes notice a strange sound from the area around his drain that "sound like an air leak." He has not had any abdominal pain, n/v/d, constipation, fevers or chills. He has not had any drainage from the drain insertion site. He does not flush the drain and notes maybe 1-2 mL QD output. He was seen by Dr. Maisie Fus on 8/2 with plans for him to be seen by [facility] for colonoscopy and to follow up with her service in about a month. He is waiting for [facility] to reach out to him to schedule this appointment.   No past medical history on file.  Past Surgical History:  Procedure Laterality Date   IR RADIOLOGIST EVAL & MGMT  11/30/2020   IR RADIOLOGIST EVAL & MGMT  12/14/2020   IR RADIOLOGIST EVAL & MGMT  12/28/2020    Allergies: Patient has no known allergies.  Medications: Prior to Admission medications   Medication Sig Start Date End Date Taking? Authorizing Provider  acetaminophen (TYLENOL) 325 MG tablet Take 2 tablets (650 mg total) by mouth every 6 (six) hours as needed for fever, mild pain or headache. 11/18/20   Marguerita Merles Latif, DO  dicyclomine (BENTYL) 20 MG tablet Take 1 tablet (20 mg total) by mouth 4 (four) times  daily -  before meals and at bedtime. 11/12/20   Wieters, Hallie C, PA-C  ibuprofen (ADVIL) 200 MG tablet Take 400-600 mg by mouth every 6 (six) hours as needed for fever, headache or mild pain.    [provider]  ondansetron (ZOFRAN) 4 MG tablet Take 1 tablet (4 mg total) by mouth every 6 (six) hours as needed for nausea. 11/18/20   Marguerita Merles Latif, DO  oxyCODONE (OXY IR/ROXICODONE) 5 MG immediate release tablet Take 1 tablet (5 mg total) by mouth every 6 (six) hours as needed for moderate pain. 11/18/20   Marguerita Merles Latif, DO  simethicone (MYLICON) 80 MG chewable tablet Chew 1 tablet (80 mg total) by mouth every 6 (six) hours as needed for flatulence. 11/18/20   Marguerita Merles Latif, DO     No family history on file.  Social History   Socioeconomic History   Marital status: Divorced    Spouse name: Not on file   Number of children: Not on file   Years of education: Not on file   Highest education level: Not on file  Occupational History   Not on file  Tobacco Use   Smoking status: Every Day    Types: Cigarettes   Smokeless tobacco: Never  Vaping Use   Vaping Use: Never used  Substance and Sexual Activity   Alcohol use: Yes   Drug use: Yes    Types: Marijuana   Sexual activity: Not on file  Other Topics Concern  Not on file  Social History Narrative   Not on file   Social Determinants of Health   Financial Resource Strain: Not on file  Food Insecurity: Not on file  Transportation Needs: Not on file  Physical Activity: Not on file  Stress: Not on file  Social Connections: Not on file    Review of Systems: A 12 point ROS discussed and pertinent positives are indicated in the HPI above.  All other systems are negative.  Review of Systems  Constitutional:  Negative for appetite change, chills and fever.  Respiratory:  Negative for cough and shortness of breath.   Cardiovascular:  Negative for chest pain.  Gastrointestinal:  Negative for abdominal pain, blood  in stool, constipation, nausea and vomiting.   Vital Signs: There were no vitals taken for this visit.  Physical Exam Constitutional:      General: He is not in acute distress. HENT:     Head: Normocephalic.  Pulmonary:     Effort: Pulmonary effort is normal.  Abdominal:     General: There is no distension.     Palpations: Abdomen is soft.     Tenderness: There is no abdominal tenderness.     Comments: (+) right TG drain to gravity with scant serous appearing output. Insertion site clean, dry, dressed appropriately. Suture and stat lock in tact. No drainage, erythema, edema or crepitus noted.  Skin:    General: Skin is warm and dry.  Neurological:     Mental Status: He is alert and oriented to person, place, and time.  Psychiatric:        Mood and Affect: Mood normal.        Behavior: Behavior normal.        Thought Content: Thought content normal.        Judgment: Judgment normal.    Mallampati Score:     Imaging: DG Sinus/Fist Tube Chk-Non GI  Result Date: 12/28/2020 INDICATION: 45 year old male with a history of perforated colitis and pericolonic abscess. Percutaneous drain placement performed on 11/15/2020. Patient has subsequently experience resolution of the abscess cavity but a persistence of a fistulous communication between the sigmoid colon and the drainage catheter. He presents today with continued small volume purulent output. EXAM: Drain injection MEDICATIONS: None ANESTHESIA/SEDATION: None COMPLICATIONS: None immediate. PROCEDURE: Initial spot imaging of the drainage catheter demonstrates a well-positioned drainage catheter in the central and common pelvis. Contrast injection results in rapid filling of the adjacent sigmoid colon. Positive for peristalsis. No significant interval change compared to recent prior imaging. IMPRESSION: Persistent and relatively unchanged fistulous communication between the drainage catheter in the adjacent sigmoid colon. Electronically  Signed   By: Malachy Moan M.D.   On: 12/28/2020 14:35   IR Radiologist Eval & Mgmt  Result Date: 12/28/2020 Please refer to notes tab for details about interventional procedure. (Op Note)   Labs:  CBC: Recent Labs    11/15/20 0453 11/16/20 1219 11/17/20 0426 11/18/20 0425  WBC 17.1* 14.6* 11.9* 10.5  HGB 11.2* 12.1* 11.2* 12.4*  HCT 34.1* 36.7* 34.1* 37.6*  PLT 296 344 338 379    COAGS: No results for input(s): INR, APTT in the last 8760 hours.  BMP: Recent Labs    11/14/20 0410 11/15/20 0453 11/17/20 0834 11/18/20 0425  NA 137 140 140 138  K 2.7* 3.8 3.4* 3.6  CL 100 107 102 100  CO2 26 27 29 28   GLUCOSE 155* 107* 100* 107*  BUN 8 10 10 10   CALCIUM 8.5*  8.6* 8.9 9.1  CREATININE 0.86 1.05 1.04 0.98  GFRNONAA >60 >60 >60 >60    LIVER FUNCTION TESTS: Recent Labs    11/14/20 0410 11/15/20 0453 11/17/20 0834 11/18/20 0425  BILITOT 0.4 0.5 0.1* 0.6  AST 17 15 19  35  ALT 27 24 25 31   ALKPHOS 65 89 56 55  PROT 7.8 6.9 7.2 7.4  ALBUMIN 3.4* 2.8* 3.0* 3.1*    TUMOR MARKERS: No results for input(s): AFPTM, CEA, CA199, CHROMGRNA in the last 8760 hours.  Assessment:  45 y/o M with history of pelvic abscess s/p right transgluteal drain placement in IR on 11/15/20 with known fistula to sigmoid colon who presents today for repeat drain injection.  Patient notes intermittent sound from drain that he describes as an air leak, it is not related to position/eating/BMs that he can tell. He sometimes hears this noise as well from his abdomen. He has been eating, drinking and toileting as usual. No longer flushing the drain as instructed and getting 1-2 mL output QD.  Drain injection today again shows known fistula to sigmoid colon. Images reviewed by Dr. 54 who recommends drain exchange - patient prefers to forego drain exchange at this time. Site is clean, dry without s/s of infection or other concerns. Discussed with patient that should he develop any s/s of  infection or other concerns to call 11/17/20 to schedule drain exchange.  Plan: - Drain exchange if patient has concerns of infection/pain, he does not wish to undergo drain exchange at this time - Schedule follow up with [facility] for colonoscopy - Maintain follow up with general surgery - No need for further drain injection/routine IR follow up given persistent fistula for >2 months - Call IR with questions or concerns regarding the drain   Electronically Signed: Fredia Sorrow PA-C 01/25/2021, 2:09 PM   Please refer to Dr. Villa Herb attestation of this note for management and plan.

## 2021-03-15 ENCOUNTER — Ambulatory Visit: Payer: Self-pay | Admitting: General Surgery

## 2021-03-15 NOTE — H&P (Signed)
PROVIDER:  Elenora Gamma, MD   MRN: [id number] DOB: 1976/06/17 DATE OF ENCOUNTER: 03/15/2021 Subjective    Chief Complaint: Diverticulitis       History of Present Illness: Justin Meadows is a 45 y.o. male smoker who is seen today for diverticular disease.  He is status post TG drain placement for a pelvic abscess due to assumed diverticular perforation. He is tolerating a diet and having good bowel function.  He denies any abdominal pain or fevers.  He has underwent several drain injection studies that show a collapsed abscess cavity with persistent fistula noted.  Recent colonoscopy shows drain embedded into the colon wall.    No past medical history on file. Past Surgical History:  Procedure Laterality Date   IR RADIOLOGIST EVAL & MGMT  11/30/2020   IR RADIOLOGIST EVAL & MGMT  12/14/2020   IR RADIOLOGIST EVAL & MGMT  12/28/2020   IR RADIOLOGIST EVAL & MGMT  01/25/2021   Social History   Socioeconomic History   Marital status: Divorced    Spouse name: Not on file   Number of children: Not on file   Years of education: Not on file   Highest education level: Not on file  Occupational History   Not on file  Tobacco Use   Smoking status: Every Day    Types: Cigarettes   Smokeless tobacco: Never  Vaping Use   Vaping Use: Never used  Substance and Sexual Activity   Alcohol use: Yes   Drug use: Yes    Types: Marijuana   Sexual activity: Not on file  Other Topics Concern   Not on file  Social History Narrative   Not on file   Social Determinants of Health   Financial Resource Strain: Not on file  Food Insecurity: Not on file  Transportation Needs: Not on file  Physical Activity: Not on file  Stress: Not on file  Social Connections: Not on file  Intimate Partner Violence: Not on file   No family history on file.  Current Outpatient Medications:    acetaminophen (TYLENOL) 325 MG tablet, Take 2 tablets (650 mg total) by mouth every 6 (six) hours as needed for  fever, mild pain or headache., Disp: 20 tablet, Rfl: 0   dicyclomine (BENTYL) 20 MG tablet, Take 1 tablet (20 mg total) by mouth 4 (four) times daily -  before meals and at bedtime., Disp: 16 tablet, Rfl: 0   ibuprofen (ADVIL) 200 MG tablet, Take 400-600 mg by mouth every 6 (six) hours as needed for fever, headache or mild pain., Disp: , Rfl:    No Known Allergies  Review of Systems - General ROS: negative for - chills, fatigue, or fever Respiratory ROS: no cough, shortness of breath, or wheezing Cardiovascular ROS: no chest pain or dyspnea on exertion Gastrointestinal ROS: no abdominal pain, change in bowel habits, or black or bloody stools Genito-Urinary ROS: no dysuria, trouble voiding, or hematuria    Objective:           General appearance - alert, well appearing, and in no distress CV: RRR Lungs: CTA Abdomen - soft, nontender     Labs, Imaging and Diagnostic Testing:   Most recent CT scan personally reviewed.  Patient's inflammation is on the left pelvic sidewall.   Assessment and Plan:  Diagnoses and all orders for this visit:   Diverticular disease of left colon   45 year old male with diverticular disease who presents to the office status post colonoscopy showing drain embedded into  the colon wall.  We will plan on proceeding with robotic assisted sigmoidectomy.  Given the location of the inflammation, I have recommended cystoscopy with firefly injection prior to surgery.  We have discussed the risk of surgery in detail.  We have discussed how smoking will increase these risk.  I recommended the patient cut back or quit smoking perioperatively to help with healing and decrease complications.  All questions were answered.   The surgery and anatomy were described to the patient as well as the risks of surgery and the possible complications.  These include: Bleeding, deep abdominal infections and possible wound complications such as hernia and infection, damage to adjacent  structures, leak of surgical connections, which can lead to other surgeries and possibly an ostomy, possible need for other procedures, such as abscess drains in radiology, possible prolonged hospital stay, possible diarrhea from removal of part of the colon, possible constipation from narcotics, possible bowel, bladder or sexual dysfunction if having rectal surgery, prolonged fatigue/weakness or appetite loss, possible early recurrence of of disease, possible complications of their medical problems such as heart disease or arrhythmias or lung problems, death (less than 1%). I believe the patient understands and wishes to proceed with the surgery.

## 2021-04-26 ENCOUNTER — Other Ambulatory Visit: Payer: Self-pay | Admitting: General Surgery

## 2021-04-26 DIAGNOSIS — K651 Peritoneal abscess: Secondary | ICD-10-CM

## 2021-04-27 ENCOUNTER — Other Ambulatory Visit: Payer: 59

## 2021-05-11 ENCOUNTER — Other Ambulatory Visit: Payer: 59

## 2021-05-17 ENCOUNTER — Other Ambulatory Visit: Payer: Self-pay | Admitting: General Surgery

## 2021-05-17 ENCOUNTER — Other Ambulatory Visit (HOSPITAL_COMMUNITY): Payer: Self-pay

## 2021-05-17 ENCOUNTER — Ambulatory Visit
Admission: RE | Admit: 2021-05-17 | Discharge: 2021-05-17 | Disposition: A | Discharge status: Home / Self Care | Payer: 59 | Source: Ambulatory Visit | Attending: General Surgery | Admitting: General Surgery

## 2021-05-17 DIAGNOSIS — K651 Peritoneal abscess: Secondary | ICD-10-CM

## 2021-05-17 HISTORY — PX: IR RADIOLOGIST EVAL & MGMT: IMG5224

## 2021-05-17 MED ORDER — SODIUM CHLORIDE FLUSH 0.9 % IV SOLN
INTRAVENOUS | 0 refills | Status: DC
  Filled 2021-05-17: qty 750, 10d supply, fill #0

## 2021-05-17 NOTE — Progress Notes (Signed)
Referring Physician(s): Thomas,Alicia  Chief Complaint: Pelvic abscess, colonic fistula  History of present illness:  The patient is seen in follow up today s/p pelvic abscess drain placement on 11/15/20 with Dr. Katherina Right.  He has had prior drain injections which demonstrates a collapsed abscess but with persistent colonic fistula.  On recent colonoscopy the drain was seen to be embedded in the colon wall.  Plan on 03/15/21 was to proceed with sigmoidectomy.  He presents today for repeat drain injection.    He is no longer flushing as drain seems clogged Scant output at skin Denies Fever, chills, N/V No current surgery date or follow up scheduled          No past medical history on file.  Past Surgical History:  Procedure Laterality Date   IR RADIOLOGIST EVAL & MGMT  11/30/2020   IR RADIOLOGIST EVAL & MGMT  12/14/2020   IR RADIOLOGIST EVAL & MGMT  12/28/2020   IR RADIOLOGIST EVAL & MGMT  01/25/2021    Allergies: Patient has no known allergies.  Medications: Prior to Admission medications   Medication Sig Start Date End Date Taking? Authorizing Provider  acetaminophen (TYLENOL) 325 MG tablet Take 2 tablets (650 mg total) by mouth every 6 (six) hours as needed for fever, mild pain or headache. 11/18/20   Marguerita Merles Latif, DO  dicyclomine (BENTYL) 20 MG tablet Take 1 tablet (20 mg total) by mouth 4 (four) times daily -  before meals and at bedtime. 11/12/20   Wieters, Hallie C, PA-C  ibuprofen (ADVIL) 200 MG tablet Take 400-600 mg by mouth every 6 (six) hours as needed for fever, headache or mild pain.    [provider]  ondansetron (ZOFRAN) 4 MG tablet Take 1 tablet (4 mg total) by mouth every 6 (six) hours as needed for nausea. 11/18/20   Marguerita Merles Latif, DO  oxyCODONE (OXY IR/ROXICODONE) 5 MG immediate release tablet Take 1 tablet (5 mg total) by mouth every 6 (six) hours as needed for moderate pain. 11/18/20   Marguerita Merles Latif, DO  simethicone (MYLICON) 80 MG chewable  tablet Chew 1 tablet (80 mg total) by mouth every 6 (six) hours as needed for flatulence. 11/18/20   Marguerita Merles Latif, DO     No family history on file.  Social History   Socioeconomic History   Marital status: Divorced    Spouse name: Not on file   Number of children: Not on file   Years of education: Not on file   Highest education level: Not on file  Occupational History   Not on file  Tobacco Use   Smoking status: Every Day    Types: Cigarettes   Smokeless tobacco: Never  Vaping Use   Vaping Use: Never used  Substance and Sexual Activity   Alcohol use: Yes   Drug use: Yes    Types: Marijuana   Sexual activity: Not on file  Other Topics Concern   Not on file  Social History Narrative   Not on file   Social Determinants of Health   Financial Resource Strain: Not on file  Food Insecurity: Not on file  Transportation Needs: Not on file  Physical Activity: Not on file  Stress: Not on file  Social Connections: Not on file     Vital Signs: There were no vitals taken for this visit.  Exam: Skin and drain intact. No output.  Drain flushes easily. Injection demonstrates immediate filling of sigmoid.  Imaging: No results found.  Labs:  CBC: Recent Labs    11/15/20 0453 11/16/20 1219 11/17/20 0426 11/18/20 0425  WBC 17.1* 14.6* 11.9* 10.5  HGB 11.2* 12.1* 11.2* 12.4*  HCT 34.1* 36.7* 34.1* 37.6*  PLT 296 344 338 379    COAGS: No results for input(s): INR, APTT in the last 8760 hours.  BMP: Recent Labs    11/14/20 0410 11/15/20 0453 11/17/20 0834 11/18/20 0425  NA 137 140 140 138  K 2.7* 3.8 3.4* 3.6  CL 100 107 102 100  CO2 26 27 29 28   GLUCOSE 155* 107* 100* 107*  BUN 8 10 10 10   CALCIUM 8.5* 8.6* 8.9 9.1  CREATININE 0.86 1.05 1.04 0.98  GFRNONAA >60 >60 >60 >60    LIVER FUNCTION TESTS: Recent Labs    11/14/20 0410 11/15/20 0453 11/17/20 0834 11/18/20 0425  BILITOT 0.4 0.5 0.1* 0.6  AST 17 15 19  35  ALT 27 24 25 31   ALKPHOS  65 89 56 55  PROT 7.8 6.9 7.2 7.4  ALBUMIN 3.4* 2.8* 3.0* 3.1*    Assessment:  Pelvic abscess --drain now seated at/within sigmoid as demonstrated by immediate filling of colon.   --needs to see Dr. 01/17/21.  He is urged to call this afternoon and voices understanding. --will see for 1 mo f/u if no surgery in the interim. --pt to begin flushing again.  Signed08/02/22, PA 05/17/2021, 1:07 PM   Please refer to Dr. attestation of this note for management and plan.

## 2021-05-20 ENCOUNTER — Other Ambulatory Visit: Payer: Self-pay | Admitting: Urology

## 2021-06-22 ENCOUNTER — Other Ambulatory Visit: Payer: 59

## 2021-06-23 ENCOUNTER — Other Ambulatory Visit (HOSPITAL_COMMUNITY): Payer: Self-pay

## 2021-06-27 NOTE — Progress Notes (Signed)
Pt. Need orders for upcoming surgery.Thank you.

## 2021-06-27 NOTE — Patient Instructions (Addendum)
DUE TO COVID-19 ONLY ONE VISITOR IS ALLOWED TO COME WITH YOU AND STAY IN THE WAITING ROOM ONLY DURING PRE OP AND PROCEDURE.   **NO VISITORS ARE ALLOWED IN THE SHORT STAY AREA OR RECOVERY ROOM!!**  IF YOU WILL BE ADMITTED INTO THE HOSPITAL YOU ARE ALLOWED ONLY TWO SUPPORT PEOPLE DURING VISITATION HOURS ONLY (7 AM -8PM)   The support person(s) must pass our screening, gel in and out, and wear a mask at all times, including in the patients room. Patients must also wear a mask when staff or their support person are in the room. Visitors GUEST BADGE MUST BE WORN VISIBLY  One adult visitor may remain with you overnight and MUST be in the room by 8 P.M.  No visitors under the age of 48. Any visitor under the age of 8 must be accompanied by an adult.    COVID SWAB TESTING MUST BE COMPLETED ON:  07/05/21 @ 8:00 AM   Site: [facility] [address] [place] Enter: Main Entrance have a seat in the waiting area to the right of main entrance (DO NOT CHECK IN AT ADMITTING!!!!!) Dial: [phone] to alert staff you have arrived  You are not required to quarantine, however you are required to wear a well-fitted mask when you are out and around people not in your household.  Hand Hygiene often Do NOT share personal items Notify your provider if you are in close contact with someone who has COVID or you develop fever 100.4 or greater, new onset of sneezing, cough, sore throat, shortness of breath or body aches.  [facility] Entrance [address], [address], must go inside of the hospital, NOT A DRIVE THRU!  (Must self quarantine after testing. Follow instructions on handout.)       Your procedure is scheduled on: 07/07/21   Report to [facility] Main Entrance    Report to short stay at : 5:15 AM   Call this number if you have problems the morning of surgery [phone] Eat a light diet the day before surgery.  Examples including  soups, broths, toast, yogurt, mashed potatoes.  Things to avoid include carbonated beverages (fizzy beverages), raw fruits and raw vegetables, or beans.   If your bowels are filled with gas, your surgeon will have difficulty visualizing your pelvic organs which increases your surgical risks.   CLEAR LIQUID DIET: Until 4:30 AM.  Foods Allowed                                                                     Foods Excluded  Water, Black Coffee and tea, regular and decaf                             liquids that you cannot  Plain Jell-O in any flavor  (No red)                                           see through such as: Fruit ices (not with fruit pulp)  milk, soups, orange juice              Iced Popsicles (No red)                                    All solid food                                   Apple juices Sports drinks like Gatorade (No red) Lightly seasoned clear broth or consume(fat free) Sugar  Sample Menu Breakfast                                Lunch                                     Supper Cranberry juice                    Beef broth                            Chicken broth Jell-O                                     Grape juice                           Apple juice Coffee or tea                        Jell-O                                      Popsicle                                                Coffee or tea                        Coffee or tea    Oral Hygiene is also important to reduce your risk of infection.                                    Remember - BRUSH YOUR TEETH THE MORNING OF SURGERY WITH YOUR REGULAR TOOTHPASTE   Do NOT smoke after Midnight   Take these medicines the morning of surgery with A SIP OF WATER: N/A  DO NOT TAKE ANY ORAL DIABETIC MEDICATIONS DAY OF YOUR SURGERY                              You may not have any metal on your body including hair pins, jewelry, and body piercing             Do not wear  lotions,  powders, perfumes/cologne, or deodorant               Men may shave face and neck.   Do not bring valuables to the hospital. [facility] IS NOT             RESPONSIBLE   FOR VALUABLES.   Contacts, dentures or bridgework may not be worn into surgery.   Bring small overnight bag day of surgery.    Patients discharged on the day of surgery will not be allowed to drive home.  We recommend you have a responsible adult to stay with you for the first 24 hours after anesthesia.   Special Instructions: Bring a copy of your healthcare power of attorney and living will documents         the day of surgery if you haven't scanned them before.              Please read over the following fact sheets you were given: IF YOU HAVE QUESTIONS ABOUT YOUR PRE-OP INSTRUCTIONS PLEASE CALL [phone]     [facility] - Preparing for Surgery Before surgery, you can play an important role.  Because skin is not sterile, your skin needs to be as free of germs as possible.  You can reduce the number of germs on your skin by washing with CHG (chlorahexidine gluconate) soap before surgery.  CHG is an antiseptic cleaner which kills germs and bonds with the skin to continue killing germs even after washing. Please DO NOT use if you have an allergy to CHG or antibacterial soaps.  If your skin becomes reddened/irritated stop using the CHG and inform your nurse when you arrive at Short Stay. Do not shave (including legs and underarms) for at least 48 hours prior to the first CHG shower.  You may shave your face/neck. Please follow these instructions carefully:  1.  Shower with CHG Soap the night before surgery and the  morning of Surgery.  2.  If you choose to wash your hair, wash your hair first as usual with your  normal  shampoo.  3.  After you shampoo, rinse your hair and body thoroughly to remove the  shampoo.                           4.  Use CHG as you would any other liquid soap.  You can apply chg directly  to  the skin and wash                       Gently with a scrungie or clean washcloth.  5.  Apply the CHG Soap to your body ONLY FROM THE NECK DOWN.   Do not use on face/ open                           Wound or open sores. Avoid contact with eyes, ears mouth and genitals (private parts).                       Wash face,  Genitals (private parts) with your normal soap.             6.  Wash thoroughly, paying special attention to the area where your surgery  will be performed.  7.  Thoroughly rinse your body with warm water from the neck down.  8.  DO NOT shower/wash with your  normal soap after using and rinsing off  the CHG Soap.                9.  Pat yourself dry with a clean towel.            10.  Wear clean pajamas.            11.  Place clean sheets on your bed the night of your first shower and do not  sleep with pets. Day of Surgery : Do not apply any lotions/deodorants the morning of surgery.  Please wear clean clothes to the hospital/surgery center.  FAILURE TO FOLLOW THESE INSTRUCTIONS MAY RESULT IN THE CANCELLATION OF YOUR SURGERY PATIENT SIGNATURE_________________________________  NURSE SIGNATURE__________________________________  ________________________________________________________________________

## 2021-06-28 ENCOUNTER — Ambulatory Visit: Payer: Self-pay | Admitting: General Surgery

## 2021-06-28 ENCOUNTER — Encounter (HOSPITAL_COMMUNITY)
Admission: RE | Admit: 2021-06-28 | Discharge: 2021-06-28 | Disposition: A | Discharge status: Home / Self Care | Payer: Commercial Managed Care - PPO | Source: Ambulatory Visit | Attending: General Surgery | Admitting: General Surgery

## 2021-06-28 ENCOUNTER — Encounter (HOSPITAL_COMMUNITY): Payer: Self-pay

## 2021-06-28 ENCOUNTER — Other Ambulatory Visit: Payer: Self-pay

## 2021-06-28 VITALS — BP 149/92 | HR 65 | Temp 98.3°F | Resp 18 | Ht 69.0 in | Wt 158.0 lb

## 2021-06-28 DIAGNOSIS — Z20822 Contact with and (suspected) exposure to covid-19: Secondary | ICD-10-CM | POA: Insufficient documentation

## 2021-06-28 DIAGNOSIS — Z01812 Encounter for preprocedural laboratory examination: Secondary | ICD-10-CM | POA: Insufficient documentation

## 2021-06-28 DIAGNOSIS — Z01818 Encounter for other preprocedural examination: Secondary | ICD-10-CM

## 2021-06-28 LAB — CBC
HCT: 41.6 % (ref 39.0–52.0)
Hemoglobin: 13.5 g/dL (ref 13.0–17.0)
MCH: 29.5 pg (ref 26.0–34.0)
MCHC: 32.5 g/dL (ref 30.0–36.0)
MCV: 91 fL (ref 80.0–100.0)
Platelets: 264 10*3/uL (ref 150–400)
RBC: 4.57 MIL/uL (ref 4.22–5.81)
RDW: 14.4 % (ref 11.5–15.5)
WBC: 8.4 10*3/uL (ref 4.0–10.5)
nRBC: 0 % (ref 0.0–0.2)

## 2021-06-28 NOTE — Progress Notes (Signed)
COVID Vaccine Completed: NO Date COVID Vaccine completed: COVID vaccine manufacturer: [employer]  COVID Test: 07/05/21 @ 8:00 AM PCP - NO PCP Cardiologist -   Chest x-ray -  EKG -  Stress Test -  ECHO -  Cardiac Cath -  Pacemaker/ICD device last checked:  Sleep Study -  CPAP -   Fasting Blood Sugar -  Checks Blood Sugar _____ times a day  Blood Thinner Instructions: Aspirin Instructions: Last Dose:  Anesthesia review: Hx: smoker  Patient denies shortness of breath, fever, cough and chest pain at PAT appointment   Patient verbalized understanding of instructions that were given to them at the PAT appointment. Patient was also instructed that they will need to review over the PAT instructions again at home before surgery.

## 2021-07-05 ENCOUNTER — Other Ambulatory Visit: Payer: Self-pay

## 2021-07-05 ENCOUNTER — Encounter (HOSPITAL_COMMUNITY)
Admission: RE | Admit: 2021-07-05 | Discharge: 2021-07-05 | Disposition: A | Discharge status: Home / Self Care | Payer: Commercial Managed Care - PPO | Source: Ambulatory Visit | Attending: General Surgery | Admitting: General Surgery

## 2021-07-05 DIAGNOSIS — Z20822 Contact with and (suspected) exposure to covid-19: Secondary | ICD-10-CM | POA: Insufficient documentation

## 2021-07-05 DIAGNOSIS — Z01818 Encounter for other preprocedural examination: Secondary | ICD-10-CM

## 2021-07-05 DIAGNOSIS — Z01812 Encounter for preprocedural laboratory examination: Secondary | ICD-10-CM | POA: Insufficient documentation

## 2021-07-05 LAB — SARS CORONAVIRUS 2 (TAT 6-24 HRS): SARS Coronavirus 2: NEGATIVE

## 2021-07-06 NOTE — Anesthesia Preprocedure Evaluation (Addendum)
Anesthesia Evaluation  Patient identified by MRN, date of birth, ID band Patient awake    Reviewed: Allergy & Precautions, NPO status , Patient's Chart, lab work & pertinent test results  Airway Mallampati: III  TM Distance: >3 FB Neck ROM: Full    Dental no notable dental hx. (+) Missing, Dental Advisory Given,    Pulmonary Current Smoker and Patient abstained from smoking.,    Pulmonary exam normal breath sounds clear to auscultation       Cardiovascular Exercise Tolerance: Good Normal cardiovascular exam Rhythm:Regular Rate:Normal     Neuro/Psych negative neurological ROS     GI/Hepatic negative GI ROS, Neg liver ROS, Diverticular dz   Endo/Other    Renal/GU   negative genitourinary   Musculoskeletal negative musculoskeletal ROS (+)   Abdominal   Peds  Hematology Lab Results      Component                Value               Date                            HGB                      13.5                06/28/2021                HCT                      41.6                06/28/2021                PLT                      264                 06/28/2021              Anesthesia Other Findings   Reproductive/Obstetrics                            Anesthesia Physical Anesthesia Plan  ASA: 2  Anesthesia Plan: General   Post-op Pain Management: Lidocaine infusion and Ketamine IV   Induction: Intravenous  PONV Risk Score and Plan: 2 and Treatment may vary due to age or medical condition, Midazolam and Ondansetron  Airway Management Planned: Oral ETT  Additional Equipment: None  Intra-op Plan:   Post-operative Plan: Extubation in OR  Informed Consent: I have reviewed the patients History and Physical, chart, labs and discussed the procedure including the risks, benefits and alternatives for the proposed anesthesia with the patient or authorized representative who has indicated  his/her understanding and acceptance.     Dental advisory given  Plan Discussed with: CRNA and Anesthesiologist  Anesthesia Plan Comments: (Ga w lidocaine infusion + ketamine)       Anesthesia Quick Evaluation

## 2021-07-07 ENCOUNTER — Inpatient Hospital Stay (HOSPITAL_COMMUNITY)
Admission: RE | Admit: 2021-07-07 | Discharge: 2021-07-09 | DRG: 329 | Disposition: A | Discharge status: Home / Self Care | Payer: Commercial Managed Care - PPO | Source: Ambulatory Visit | Attending: General Surgery | Admitting: General Surgery

## 2021-07-07 ENCOUNTER — Inpatient Hospital Stay (HOSPITAL_COMMUNITY): Payer: Commercial Managed Care - PPO | Admitting: Anesthesiology

## 2021-07-07 ENCOUNTER — Encounter (HOSPITAL_COMMUNITY): Payer: Self-pay | Admitting: General Surgery

## 2021-07-07 ENCOUNTER — Encounter (HOSPITAL_COMMUNITY)
Admission: RE | Disposition: A | Discharge status: Home / Self Care | Payer: Self-pay | Source: Ambulatory Visit | Attending: General Surgery

## 2021-07-07 DIAGNOSIS — K5780 Diverticulitis of intestine, part unspecified, with perforation and abscess without bleeding: Secondary | ICD-10-CM | POA: Diagnosis present

## 2021-07-07 DIAGNOSIS — K651 Peritoneal abscess: Secondary | ICD-10-CM | POA: Diagnosis present

## 2021-07-07 DIAGNOSIS — K5790 Diverticulosis of intestine, part unspecified, without perforation or abscess without bleeding: Secondary | ICD-10-CM | POA: Diagnosis present

## 2021-07-07 DIAGNOSIS — Z20822 Contact with and (suspected) exposure to covid-19: Secondary | ICD-10-CM | POA: Diagnosis present

## 2021-07-07 DIAGNOSIS — K660 Peritoneal adhesions (postprocedural) (postinfection): Secondary | ICD-10-CM | POA: Diagnosis present

## 2021-07-07 SURGERY — COLECTOMY, PARTIAL, ROBOT-ASSISTED, LAPAROSCOPIC
Anesthesia: General | Site: Abdomen

## 2021-07-07 MED ORDER — SACCHAROMYCES BOULARDII 250 MG PO CAPS
250.0000 mg | ORAL_CAPSULE | Freq: Two times a day (BID) | ORAL | Status: DC
  Administered 2021-07-07 – 2021-07-09 (×4): 250 mg via ORAL
  Filled 2021-07-07 (×4): qty 1

## 2021-07-07 MED ORDER — ENSURE PRE-SURGERY PO LIQD
592.0000 mL | Freq: Once | ORAL | Status: DC
  Filled 2021-07-07: qty 592

## 2021-07-07 MED ORDER — LACTATED RINGERS IV SOLN: INTRAVENOUS | Status: DC | PRN

## 2021-07-07 MED ORDER — DEXAMETHASONE SODIUM PHOSPHATE 10 MG/ML IJ SOLN
INTRAMUSCULAR | Status: DC | PRN
  Administered 2021-07-07: 8 mg via INTRAVENOUS

## 2021-07-07 MED ORDER — MIDAZOLAM HCL 5 MG/5ML IJ SOLN
INTRAMUSCULAR | Status: DC | PRN
  Administered 2021-07-07: 2 mg via INTRAVENOUS

## 2021-07-07 MED ORDER — HEPARIN SODIUM (PORCINE) 5000 UNIT/ML IJ SOLN
5000.0000 [IU] | Freq: Once | INTRAMUSCULAR | Status: AC
  Administered 2021-07-07: 5000 [IU] via SUBCUTANEOUS
  Filled 2021-07-07: qty 1

## 2021-07-07 MED ORDER — ENSURE SURGERY PO LIQD
237.0000 mL | Freq: Two times a day (BID) | ORAL | Status: DC
  Administered 2021-07-08 – 2021-07-09 (×3): 237 mL via ORAL

## 2021-07-07 MED ORDER — BUPIVACAINE LIPOSOME 1.3 % IJ SUSP
INTRAMUSCULAR | Status: DC | PRN
  Administered 2021-07-07: 20 mL

## 2021-07-07 MED ORDER — SODIUM CHLORIDE 0.9 % IV SOLN
2.0000 g | INTRAVENOUS | Status: AC
  Administered 2021-07-07: 2 g via INTRAVENOUS
  Filled 2021-07-07: qty 2

## 2021-07-07 MED ORDER — HYDROMORPHONE HCL 1 MG/ML IJ SOLN: 0.2500 mg | INTRAMUSCULAR | Status: DC | PRN

## 2021-07-07 MED ORDER — FENTANYL CITRATE (PF) 100 MCG/2ML IJ SOLN
INTRAMUSCULAR | Status: AC
  Filled 2021-07-07: qty 2

## 2021-07-07 MED ORDER — SODIUM CHLORIDE 0.9 % IR SOLN
Status: DC | PRN
Start: 2021-07-07 — End: 2021-07-07
  Administered 2021-07-07: 1000 mL

## 2021-07-07 MED ORDER — FENTANYL CITRATE (PF) 100 MCG/2ML IJ SOLN
INTRAMUSCULAR | Status: DC | PRN
Start: 2021-07-07 — End: 2021-07-07
  Administered 2021-07-07 (×3): 50 ug via INTRAVENOUS
  Administered 2021-07-07: 100 ug via INTRAVENOUS

## 2021-07-07 MED ORDER — LIDOCAINE HCL 2 % IJ SOLN
INTRAMUSCULAR | Status: AC
  Filled 2021-07-07: qty 20

## 2021-07-07 MED ORDER — ALVIMOPAN 12 MG PO CAPS
12.0000 mg | ORAL_CAPSULE | ORAL | Status: AC
  Administered 2021-07-07: 12 mg via ORAL
  Filled 2021-07-07: qty 1

## 2021-07-07 MED ORDER — PHENYLEPHRINE HCL (PRESSORS) 10 MG/ML IV SOLN
INTRAVENOUS | Status: AC
  Filled 2021-07-07: qty 1

## 2021-07-07 MED ORDER — TRAMADOL HCL 50 MG PO TABS
50.0000 mg | ORAL_TABLET | Freq: Four times a day (QID) | ORAL | Status: DC | PRN
  Administered 2021-07-07: 50 mg via ORAL
  Filled 2021-07-07: qty 1
  Filled 2021-07-07: qty 2

## 2021-07-07 MED ORDER — LACTATED RINGERS IV SOLN: INTRAVENOUS | Status: DC

## 2021-07-07 MED ORDER — ONDANSETRON HCL 4 MG/2ML IJ SOLN: 4.0000 mg | Freq: Once | INTRAMUSCULAR | Status: DC | PRN

## 2021-07-07 MED ORDER — GABAPENTIN 300 MG PO CAPS
300.0000 mg | ORAL_CAPSULE | ORAL | Status: AC
  Administered 2021-07-07: 300 mg via ORAL
  Filled 2021-07-07: qty 1

## 2021-07-07 MED ORDER — DIPHENHYDRAMINE HCL 50 MG/ML IJ SOLN
25.0000 mg | Freq: Four times a day (QID) | INTRAMUSCULAR | Status: DC | PRN
Start: 2021-07-07 — End: 2021-07-09

## 2021-07-07 MED ORDER — INDOCYANINE GREEN 25 MG IV SOLR
INTRAVENOUS | Status: DC | PRN
Start: 2021-07-07 — End: 2021-07-07
  Administered 2021-07-07: 6.25 mg via INTRAVENOUS

## 2021-07-07 MED ORDER — HYDROMORPHONE HCL 1 MG/ML IJ SOLN
0.5000 mg | INTRAMUSCULAR | Status: DC | PRN
  Administered 2021-07-07 – 2021-07-08 (×3): 0.5 mg via INTRAVENOUS
  Filled 2021-07-07 (×3): qty 0.5

## 2021-07-07 MED ORDER — ACETAMINOPHEN 500 MG PO TABS
1000.0000 mg | ORAL_TABLET | Freq: Four times a day (QID) | ORAL | Status: DC
  Administered 2021-07-07 – 2021-07-08 (×4): 1000 mg via ORAL
  Filled 2021-07-07 (×4): qty 2

## 2021-07-07 MED ORDER — OXYCODONE HCL 5 MG/5ML PO SOLN: 5.0000 mg | Freq: Once | ORAL | Status: DC | PRN

## 2021-07-07 MED ORDER — BUPIVACAINE LIPOSOME 1.3 % IJ SUSP
INTRAMUSCULAR | Status: AC
  Filled 2021-07-07: qty 20

## 2021-07-07 MED ORDER — BUPIVACAINE LIPOSOME 1.3 % IJ SUSP: 20.0000 mL | Freq: Once | INTRAMUSCULAR | Status: DC

## 2021-07-07 MED ORDER — KETAMINE HCL 10 MG/ML IJ SOLN
INTRAMUSCULAR | Status: DC | PRN
  Administered 2021-07-07 (×2): 20 mg via INTRAVENOUS

## 2021-07-07 MED ORDER — BUPIVACAINE-EPINEPHRINE 0.25% -1:200000 IJ SOLN
INTRAMUSCULAR | Status: DC | PRN
  Administered 2021-07-07: 30 mL

## 2021-07-07 MED ORDER — ACETAMINOPHEN 500 MG PO TABS
1000.0000 mg | ORAL_TABLET | ORAL | Status: AC
  Administered 2021-07-07: 1000 mg via ORAL
  Filled 2021-07-07: qty 2

## 2021-07-07 MED ORDER — ENSURE PRE-SURGERY PO LIQD
296.0000 mL | Freq: Once | ORAL | Status: DC
  Filled 2021-07-07: qty 296

## 2021-07-07 MED ORDER — KCL IN DEXTROSE-NACL 20-5-0.45 MEQ/L-%-% IV SOLN
INTRAVENOUS | Status: DC
  Filled 2021-07-07 (×3): qty 1000

## 2021-07-07 MED ORDER — DEXAMETHASONE SODIUM PHOSPHATE 10 MG/ML IJ SOLN
INTRAMUSCULAR | Status: AC
  Filled 2021-07-07: qty 1

## 2021-07-07 MED ORDER — NEOSTIGMINE METHYLSULFATE 3 MG/3ML IV SOSY
PREFILLED_SYRINGE | INTRAVENOUS | Status: AC
  Filled 2021-07-07: qty 3

## 2021-07-07 MED ORDER — ENOXAPARIN SODIUM 40 MG/0.4ML IJ SOSY
40.0000 mg | PREFILLED_SYRINGE | INTRAMUSCULAR | Status: DC
  Administered 2021-07-08 – 2021-07-09 (×2): 40 mg via SUBCUTANEOUS
  Filled 2021-07-07 (×2): qty 0.4

## 2021-07-07 MED ORDER — ORAL CARE MOUTH RINSE: 15.0000 mL | Freq: Once | OROMUCOSAL | Status: AC

## 2021-07-07 MED ORDER — ONDANSETRON HCL 4 MG/2ML IJ SOLN: 4.0000 mg | Freq: Four times a day (QID) | INTRAMUSCULAR | Status: DC | PRN

## 2021-07-07 MED ORDER — ONDANSETRON HCL 4 MG PO TABS: 4.0000 mg | ORAL_TABLET | Freq: Four times a day (QID) | ORAL | Status: DC | PRN

## 2021-07-07 MED ORDER — LIDOCAINE 2% (20 MG/ML) 5 ML SYRINGE
INTRAMUSCULAR | Status: DC | PRN
  Administered 2021-07-07: 1.5 mg/kg/h via INTRAVENOUS
  Administered 2021-07-07: 100 mg via INTRAVENOUS

## 2021-07-07 MED ORDER — PROPOFOL 10 MG/ML IV BOLUS
INTRAVENOUS | Status: AC
  Filled 2021-07-07: qty 20

## 2021-07-07 MED ORDER — ALUM & MAG HYDROXIDE-SIMETH 200-200-20 MG/5ML PO SUSP: 30.0000 mL | Freq: Four times a day (QID) | ORAL | Status: DC | PRN

## 2021-07-07 MED ORDER — DIPHENHYDRAMINE HCL 25 MG PO CAPS
25.0000 mg | ORAL_CAPSULE | Freq: Four times a day (QID) | ORAL | Status: DC | PRN
  Administered 2021-07-08: 25 mg via ORAL
  Filled 2021-07-07: qty 1

## 2021-07-07 MED ORDER — LACTATED RINGERS IR SOLN
Status: DC | PRN
  Administered 2021-07-07: 1000 mL

## 2021-07-07 MED ORDER — LIDOCAINE HCL (PF) 2 % IJ SOLN
INTRAMUSCULAR | Status: AC
  Filled 2021-07-07: qty 5

## 2021-07-07 MED ORDER — ONDANSETRON HCL 4 MG/2ML IJ SOLN
INTRAMUSCULAR | Status: AC
  Filled 2021-07-07: qty 2

## 2021-07-07 MED ORDER — ROCURONIUM BROMIDE 10 MG/ML (PF) SYRINGE
PREFILLED_SYRINGE | INTRAVENOUS | Status: DC | PRN
  Administered 2021-07-07: 20 mg via INTRAVENOUS
  Administered 2021-07-07: 50 mg via INTRAVENOUS

## 2021-07-07 MED ORDER — KETAMINE HCL 50 MG/5ML IJ SOSY
PREFILLED_SYRINGE | INTRAMUSCULAR | Status: AC
  Filled 2021-07-07: qty 5

## 2021-07-07 MED ORDER — FENTANYL CITRATE (PF) 250 MCG/5ML IJ SOLN
INTRAMUSCULAR | Status: AC
  Filled 2021-07-07: qty 5

## 2021-07-07 MED ORDER — ALVIMOPAN 12 MG PO CAPS
12.0000 mg | ORAL_CAPSULE | Freq: Two times a day (BID) | ORAL | Status: DC
  Administered 2021-07-08: 12 mg via ORAL
  Filled 2021-07-07: qty 1

## 2021-07-07 MED ORDER — MIDAZOLAM HCL 2 MG/2ML IJ SOLN
INTRAMUSCULAR | Status: AC
  Filled 2021-07-07: qty 2

## 2021-07-07 MED ORDER — METOPROLOL TARTRATE 5 MG/5ML IV SOLN: 5.0000 mg | Freq: Four times a day (QID) | INTRAVENOUS | Status: DC | PRN

## 2021-07-07 MED ORDER — 0.9 % SODIUM CHLORIDE (POUR BTL) OPTIME
TOPICAL | Status: DC | PRN
  Administered 2021-07-07: 2000 mL

## 2021-07-07 MED ORDER — OXYCODONE HCL 5 MG PO TABS: 5.0000 mg | ORAL_TABLET | Freq: Once | ORAL | Status: DC | PRN

## 2021-07-07 MED ORDER — METHYLENE BLUE 0.5 % INJ SOLN
INTRAVENOUS | Status: AC
  Filled 2021-07-07: qty 10

## 2021-07-07 MED ORDER — ONDANSETRON HCL 4 MG/2ML IJ SOLN
INTRAMUSCULAR | Status: DC | PRN
Start: 2021-07-07 — End: 2021-07-07
  Administered 2021-07-07: 4 mg via INTRAVENOUS

## 2021-07-07 MED ORDER — PROPOFOL 10 MG/ML IV BOLUS
INTRAVENOUS | Status: DC | PRN
  Administered 2021-07-07: 200 mg via INTRAVENOUS

## 2021-07-07 MED ORDER — STERILE WATER FOR INJECTION IJ SOLN
INTRAMUSCULAR | Status: AC
  Filled 2021-07-07: qty 10

## 2021-07-07 MED ORDER — INDOCYANINE GREEN 25 MG IV SOLR
INTRAVENOUS | Status: DC | PRN
  Administered 2021-07-07: 20 mg

## 2021-07-07 MED ORDER — PHENYLEPHRINE 40 MCG/ML (10ML) SYRINGE FOR IV PUSH (FOR BLOOD PRESSURE SUPPORT)
PREFILLED_SYRINGE | INTRAVENOUS | Status: DC | PRN
  Administered 2021-07-07: 80 ug via INTRAVENOUS

## 2021-07-07 MED ORDER — BUPIVACAINE-EPINEPHRINE (PF) 0.25% -1:200000 IJ SOLN
INTRAMUSCULAR | Status: AC
  Filled 2021-07-07: qty 30

## 2021-07-07 MED ORDER — SUGAMMADEX SODIUM 200 MG/2ML IV SOLN
INTRAVENOUS | Status: DC | PRN
  Administered 2021-07-07: 180 mg via INTRAVENOUS

## 2021-07-07 MED ORDER — CHLORHEXIDINE GLUCONATE 0.12 % MT SOLN
15.0000 mL | Freq: Once | OROMUCOSAL | Status: AC
  Administered 2021-07-07: 15 mL via OROMUCOSAL

## 2021-07-07 MED ORDER — KETOROLAC TROMETHAMINE 30 MG/ML IJ SOLN: 30.0000 mg | Freq: Once | INTRAMUSCULAR | Status: DC | PRN

## 2021-07-07 MED ORDER — GABAPENTIN 300 MG PO CAPS
300.0000 mg | ORAL_CAPSULE | Freq: Two times a day (BID) | ORAL | Status: DC
  Administered 2021-07-07 – 2021-07-09 (×4): 300 mg via ORAL
  Filled 2021-07-07 (×4): qty 1

## 2021-07-07 MED ORDER — ROCURONIUM BROMIDE 10 MG/ML (PF) SYRINGE
PREFILLED_SYRINGE | INTRAVENOUS | Status: AC
  Filled 2021-07-07: qty 10

## 2021-07-07 SURGICAL SUPPLY — 105 items
ADAPTER GOLDBERG URETERAL (ADAPTER) IMPLANT
BAG COUNTER SPONGE SURGICOUNT (BAG) ×2 IMPLANT
BAG URO CATCHER STRL LF (MISCELLANEOUS) ×3 IMPLANT
BLADE EXTENDED COATED 6.5IN (ELECTRODE) IMPLANT
CANNULA REDUC XI 12-8 STAPL (CANNULA)
CANNULA REDUCER 12-8 DVNC XI (CANNULA) IMPLANT
CATH URETL OPEN 5X70 (CATHETERS) ×1 IMPLANT
CELLS DAT CNTRL 66122 CELL SVR (MISCELLANEOUS) IMPLANT
CLOTH BEACON ORANGE TIMEOUT ST (SAFETY) ×3 IMPLANT
COVER SURGICAL LIGHT HANDLE (MISCELLANEOUS) ×6 IMPLANT
COVER TIP SHEARS 8 DVNC (MISCELLANEOUS) ×2 IMPLANT
COVER TIP SHEARS 8MM DA VINCI (MISCELLANEOUS) ×1
DECANTER SPIKE VIAL GLASS SM (MISCELLANEOUS) IMPLANT
DERMABOND ADVANCED (GAUZE/BANDAGES/DRESSINGS) ×1
DERMABOND ADVANCED .7 DNX12 (GAUZE/BANDAGES/DRESSINGS) IMPLANT
DRAIN CHANNEL 19F RND (DRAIN) IMPLANT
DRAPE ARM DVNC X/XI (DISPOSABLE) ×8 IMPLANT
DRAPE COLUMN DVNC XI (DISPOSABLE) ×2 IMPLANT
DRAPE DA VINCI XI ARM (DISPOSABLE) ×4
DRAPE DA VINCI XI COLUMN (DISPOSABLE) ×1
DRAPE SURG IRRIG POUCH 19X23 (DRAPES) ×3 IMPLANT
DRSG OPSITE POSTOP 4X10 (GAUZE/BANDAGES/DRESSINGS) ×1 IMPLANT
DRSG OPSITE POSTOP 4X6 (GAUZE/BANDAGES/DRESSINGS) IMPLANT
DRSG OPSITE POSTOP 4X8 (GAUZE/BANDAGES/DRESSINGS) IMPLANT
ELECT PENCIL ROCKER SW 15FT (MISCELLANEOUS) ×3 IMPLANT
ELECT REM PT RETURN 15FT ADLT (MISCELLANEOUS) ×3 IMPLANT
ENDOLOOP SUT PDS II  0 18 (SUTURE)
ENDOLOOP SUT PDS II 0 18 (SUTURE) IMPLANT
EVACUATOR SILICONE 100CC (DRAIN) IMPLANT
GAUZE SPONGE 4X4 12PLY STRL (GAUZE/BANDAGES/DRESSINGS) ×1 IMPLANT
GLOVE SURG ENC MOIS LTX SZ6.5 (GLOVE) ×9 IMPLANT
GLOVE SURG ENC TEXT LTX SZ7.5 (GLOVE) ×3 IMPLANT
GLOVE SURG UNDER LTX SZ6.5 (GLOVE) ×5 IMPLANT
GLOVE SURG UNDER POLY LF SZ7 (GLOVE) ×6 IMPLANT
GOWN STRL REUS W/TWL LRG LVL3 (GOWN DISPOSABLE) ×3 IMPLANT
GOWN STRL REUS W/TWL XL LVL3 (GOWN DISPOSABLE) ×14 IMPLANT
GRASPER SUT TROCAR 14GX15 (MISCELLANEOUS) IMPLANT
GUIDEWIRE ANG ZIPWIRE 038X150 (WIRE) IMPLANT
GUIDEWIRE STR DUAL SENSOR (WIRE) ×1 IMPLANT
HOLDER FOLEY CATH W/STRAP (MISCELLANEOUS) ×3 IMPLANT
IRRIG SUCT STRYKERFLOW 2 WTIP (MISCELLANEOUS) ×3
IRRIGATION SUCT STRKRFLW 2 WTP (MISCELLANEOUS) ×2 IMPLANT
KIT PROCEDURE DA VINCI SI (MISCELLANEOUS) ×1
KIT PROCEDURE DVNC SI (MISCELLANEOUS) ×2 IMPLANT
KIT TURNOVER KIT A (KITS) IMPLANT
MANIFOLD NEPTUNE II (INSTRUMENTS) ×3 IMPLANT
NDL INSUFFLATION 14GA 120MM (NEEDLE) ×2 IMPLANT
NEEDLE INSUFFLATION 14GA 120MM (NEEDLE) ×3 IMPLANT
PACK CARDIOVASCULAR III (CUSTOM PROCEDURE TRAY) ×3 IMPLANT
PACK COLON (CUSTOM PROCEDURE TRAY) ×3 IMPLANT
PACK CYSTO (CUSTOM PROCEDURE TRAY) ×3 IMPLANT
PAD POSITIONING PINK XL (MISCELLANEOUS) ×3 IMPLANT
RELOAD STAPLE 60 3.5 BLU DVNC (STAPLE) IMPLANT
RELOAD STAPLE 60 4.3 GRN DVNC (STAPLE) IMPLANT
RELOAD STAPLER 3.5X60 BLU DVNC (STAPLE) IMPLANT
RELOAD STAPLER 4.3X60 GRN DVNC (STAPLE) ×2 IMPLANT
RETRACTOR WND ALEXIS 18 MED (MISCELLANEOUS) IMPLANT
RTRCTR WOUND ALEXIS 18CM MED (MISCELLANEOUS)
SCISSORS LAP 5X35 DISP (ENDOMECHANICALS) IMPLANT
SEAL CANN UNIV 5-8 DVNC XI (MISCELLANEOUS) ×6 IMPLANT
SEAL XI 5MM-8MM UNIVERSAL (MISCELLANEOUS) ×4
SEALER VESSEL DA VINCI XI (MISCELLANEOUS) ×1
SEALER VESSEL EXT DVNC XI (MISCELLANEOUS) ×2 IMPLANT
SOLUTION ELECTROLUBE (MISCELLANEOUS) ×2 IMPLANT
STAPLER 60 DA VINCI SURE FORM (STAPLE) ×1
STAPLER 60 SUREFORM DVNC (STAPLE) IMPLANT
STAPLER CANNULA SEAL DVNC XI (STAPLE) IMPLANT
STAPLER CANNULA SEAL XI (STAPLE) ×1
STAPLER ECHELON POWER CIR 29 (STAPLE) ×1 IMPLANT
STAPLER ECHELON POWER CIR 31 (STAPLE) IMPLANT
STAPLER RELOAD 3.5X60 BLU DVNC (STAPLE)
STAPLER RELOAD 3.5X60 BLUE (STAPLE)
STAPLER RELOAD 4.3X60 GREEN (STAPLE) ×1
STAPLER RELOAD 4.3X60 GRN DVNC (STAPLE) ×2
STOPCOCK 4 WAY LG BORE MALE ST (IV SETS) ×6 IMPLANT
SUT ETHILON 2 0 PS N (SUTURE) IMPLANT
SUT NOVA NAB DX-16 0-1 5-0 T12 (SUTURE) ×6 IMPLANT
SUT PROLENE 2 0 KS (SUTURE) ×1 IMPLANT
SUT SILK 2 0 (SUTURE) ×1
SUT SILK 2 0 SH CR/8 (SUTURE) IMPLANT
SUT SILK 2-0 18XBRD TIE 12 (SUTURE) ×2 IMPLANT
SUT SILK 3 0 (SUTURE)
SUT SILK 3 0 SH CR/8 (SUTURE) ×3 IMPLANT
SUT SILK 3-0 18XBRD TIE 12 (SUTURE) IMPLANT
SUT V-LOC BARB 180 2/0GR6 GS22 (SUTURE)
SUT VIC AB 2-0 SH 18 (SUTURE) IMPLANT
SUT VIC AB 2-0 SH 27 (SUTURE)
SUT VIC AB 2-0 SH 27X BRD (SUTURE) IMPLANT
SUT VIC AB 3-0 SH 18 (SUTURE) IMPLANT
SUT VIC AB 4-0 PS2 27 (SUTURE) ×6 IMPLANT
SUT VICRYL 0 UR6 27IN ABS (SUTURE) ×3 IMPLANT
SUTURE V-LC BRB 180 2/0GR6GS22 (SUTURE) IMPLANT
SYR 10ML ECCENTRIC (SYRINGE) ×3 IMPLANT
SYS LAPSCP GELPORT 120MM (MISCELLANEOUS)
SYS WOUND ALEXIS 18CM MED (MISCELLANEOUS)
SYSTEM LAPSCP GELPORT 120MM (MISCELLANEOUS) IMPLANT
SYSTEM WOUND ALEXIS 18CM MED (MISCELLANEOUS) IMPLANT
TAPE CLOTH SURG 4X10 WHT LF (GAUZE/BANDAGES/DRESSINGS) ×1 IMPLANT
TOWEL OR 17X26 10 PK STRL BLUE (TOWEL DISPOSABLE) IMPLANT
TOWEL OR NON WOVEN STRL DISP B (DISPOSABLE) ×3 IMPLANT
TRAY FOLEY MTR SLVR 16FR STAT (SET/KITS/TRAYS/PACK) ×3 IMPLANT
TROCAR ADV FIXATION 5X100MM (TROCAR) ×3 IMPLANT
TUBING CONNECTING 10 (TUBING) ×9 IMPLANT
TUBING INSUFFLATION 10FT LAP (TUBING) ×3 IMPLANT
TUBING UROLOGY SET (TUBING) IMPLANT

## 2021-07-07 NOTE — H&P (Signed)
Cc: Pelvic Abscess  History of present illness: 34M with history of diverticular disease who developed an abscess requiring a drain.  He has recovered and  presents today for sigmoid resection.  As part of the surgery Dr. Maisie Fus is requested that cystoscopy and injection of firefly be performed to help facilitate the dissection.  The patient is otherwise healthy with no significant comorbidities.  He has never had surgery before.  Review of systems: A 12 point comprehensive review of systems was obtained and is negative unless otherwise stated in the history of present illness.  Patient Active Problem List   Diagnosis Date Noted   Pelvic abscess in male [facility]) 11/15/2020   Abdominal pain 11/14/2020    No current facility-administered medications on file prior to encounter.   Current Outpatient Medications on File Prior to Encounter  Medication Sig Dispense Refill   acetaminophen (TYLENOL) 325 MG tablet Take 2 tablets (650 mg total) by mouth every 6 (six) hours as needed for fever, mild pain or headache. (Patient not taking: Reported on 06/20/2021) 20 tablet 0   dicyclomine (BENTYL) 20 MG tablet Take 1 tablet (20 mg total) by mouth 4 (four) times daily -  before meals and at bedtime. (Patient not taking: Reported on 06/20/2021) 16 tablet 0   ondansetron (ZOFRAN) 4 MG tablet Take 1 tablet (4 mg total) by mouth every 6 (six) hours as needed for nausea. (Patient not taking: Reported on 06/20/2021) 20 tablet 0   oxyCODONE (OXY IR/ROXICODONE) 5 MG immediate release tablet Take 1 tablet (5 mg total) by mouth every 6 (six) hours as needed for moderate pain. (Patient not taking: Reported on 06/20/2021) 10 tablet 0   simethicone (MYLICON) 80 MG chewable tablet Chew 1 tablet (80 mg total) by mouth every 6 (six) hours as needed for flatulence. (Patient not taking: Reported on 06/20/2021) 30 tablet 0   sodium chloride flush 0.9 % SOLN injection Use as Directed. (Patient not taking: Reported on 06/20/2021) 750 mL 0     History reviewed. No pertinent past medical history.  Past Surgical History:  Procedure Laterality Date   IR RADIOLOGIST EVAL & MGMT  11/30/2020   IR RADIOLOGIST EVAL & MGMT  12/14/2020   IR RADIOLOGIST EVAL & MGMT  12/28/2020   IR RADIOLOGIST EVAL & MGMT  01/25/2021   IR RADIOLOGIST EVAL & MGMT  05/17/2021    Social History   Tobacco Use   Smoking status: Every Day    Types: Cigarettes   Smokeless tobacco: Never  Vaping Use   Vaping Use: Never used  Substance Use Topics   Alcohol use: Not Currently   Drug use: Yes    Types: Marijuana    History reviewed. No pertinent family history.  PE: Vitals:   07/07/21 0558  BP: 133/88  Pulse: 67  Resp: 18  Temp: 98.8 F (37.1 C)  TempSrc: Oral  SpO2: 98%   Patient appears to be in no acute distress  patient is alert and oriented x3 Atraumatic normocephalic head No cervical or supraclavicular lymphadenopathy appreciated No increased work of breathing, no audible wheezes/rhonchi Regular sinus rhythm/rate Abdomen is soft, nontender, nondistended, no CVA or suprapubic tenderness Lower extremities are symmetric without appreciable edema Grossly neurologically intact No identifiable skin lesions  No results for input(s): WBC, HGB, HCT in the last 72 hours. No results for input(s): NA, K, CL, CO2, GLUCOSE, BUN, CREATININE, CALCIUM in the last 72 hours. No results for input(s): LABPT, INR in the last 72 hours. No results for  input(s): LABURIN in the last 72 hours. Results for orders placed or performed during the hospital encounter of 07/05/21  SARS CORONAVIRUS 2 (TAT 6-24 HRS) Nasopharyngeal Nasopharyngeal Swab     Status: None   Collection Time: 07/04/21  7:27 PM   Specimen: Nasopharyngeal Swab  Result Value Ref Range Status   SARS Coronavirus 2 NEGATIVE NEGATIVE Final    Comment: (NOTE) SARS-CoV-2 target nucleic acids are NOT DETECTED.  The SARS-CoV-2 RNA is generally detectable in upper and lower respiratory specimens  during the acute phase of infection. Negative results do not preclude SARS-CoV-2 infection, do not rule out co-infections with other pathogens, and should not be used as the sole basis for treatment or other patient management decisions. Negative results must be combined with clinical observations, patient history, and epidemiological information. The expected result is Negative.  Fact Sheet for Patients: HairSlick.no  Fact Sheet for Healthcare Providers: quierodirigir.com  This test is not yet approved or cleared by the [place] FDA and  has been authorized for detection and/or diagnosis of SARS-CoV-2 by FDA under an Emergency Use Authorization (EUA). This EUA will remain  in effect (meaning this test can be used) for the duration of the COVID-19 declaration under Se ction 564(b)(1) of the Act, 21 U.S.C. section 360bbb-3(b)(1), unless the authorization is terminated or revoked sooner.  Performed at [facility], [address] [place], [place] [address]     Imaging: Independently reviewed the patient's CT scans performed December 2022 demonstrating a large perirectal abscess with air within the bladder concerning for fistula.  Imp: Severe diverticulitis with subsequent abscess.  This is now been drained.  Presents today for surgery.  Recommendations: Plan to perform cystoscopy and injection of firefly contrast bilaterally.  Crist Fat

## 2021-07-07 NOTE — Anesthesia Procedure Notes (Signed)

## 2021-07-07 NOTE — H&P (Signed)
Chief Complaint: Diverticulitis       History of Present Illness: Justin Meadows is a 46 y.o. male smoker who is seen today for diverticular disease.  He is status post TG drain placement for a pelvic abscess due to assumed diverticular perforation. He is tolerating a diet and having good bowel function.  He denies any abdominal pain or fevers.  He has underwent several drain injection studies that show a collapsed abscess cavity with persistent fistula noted.  Recent colonoscopy shows drain embedded into the colon wall.    No past medical history on file.      Past Surgical History:  Procedure Laterality Date   IR RADIOLOGIST EVAL & MGMT   11/30/2020   IR RADIOLOGIST EVAL & MGMT   12/14/2020   IR RADIOLOGIST EVAL & MGMT   12/28/2020   IR RADIOLOGIST EVAL & MGMT   01/25/2021    Social History         Socioeconomic History   Marital status: Divorced      Spouse name: Not on file   Number of children: Not on file   Years of education: Not on file   Highest education level: Not on file  Occupational History   Not on file  Tobacco Use   Smoking status: Every Day      Types: Cigarettes   Smokeless tobacco: Never  Vaping Use   Vaping Use: Never used  Substance and Sexual Activity   Alcohol use: Yes   Drug use: Yes      Types: Marijuana   Sexual activity: Not on file  Other Topics Concern   Not on file  Social History Narrative   Not on file    Social Determinants of Health    Financial Resource Strain: Not on file  Food Insecurity: Not on file  Transportation Needs: Not on file  Physical Activity: Not on file  Stress: Not on file  Social Connections: Not on file  Intimate Partner Violence: Not on file    No family history on file.   Current Outpatient Medications:    acetaminophen (TYLENOL) 325 MG tablet, Take 2 tablets (650 mg total) by mouth every 6 (six) hours as needed for fever, mild pain or headache., Disp: 20 tablet, Rfl: 0   dicyclomine (BENTYL) 20 MG tablet,  Take 1 tablet (20 mg total) by mouth 4 (four) times daily -  before meals and at bedtime., Disp: 16 tablet, Rfl: 0   ibuprofen (ADVIL) 200 MG tablet, Take 400-600 mg by mouth every 6 (six) hours as needed for fever, headache or mild pain., Disp: , Rfl:     No Known Allergies   Review of Systems - General ROS: negative for - chills, fatigue, or fever Respiratory ROS: no cough, shortness of breath, or wheezing Cardiovascular ROS: no chest pain or dyspnea on exertion Gastrointestinal ROS: no abdominal pain, change in bowel habits, or black or bloody stools Genito-Urinary ROS: no dysuria, trouble voiding, or hematuria    Objective:       BP 133/88    Pulse 67    Temp 98.8 F (37.1 C) (Oral)    Resp 18    SpO2 98%       General appearance - alert, well appearing, and in no distress CV: RRR Lungs: CTA Abdomen - soft, nontender     Labs, Imaging and Diagnostic Testing:   Most recent CT scan personally reviewed.  Patient's inflammation is on the left pelvic sidewall.   Assessment  and Plan:  Diagnoses and all orders for this visit:   Diverticular disease of left colon   46 year old male with diverticular disease who presents to the office status post colonoscopy showing drain embedded into the colon wall.  We will plan on proceeding with robotic assisted sigmoidectomy.  Given the location of the inflammation, I have recommended cystoscopy with firefly injection prior to surgery.  We have discussed the risk of surgery in detail.  We have discussed how smoking will increase these risk.  I recommended the patient cut back or quit smoking perioperatively to help with healing and decrease complications.  All questions were answered.   The surgery and anatomy were described to the patient as well as the risks of surgery and the possible complications.  These include: Bleeding, deep abdominal infections and possible wound complications such as hernia and infection, damage to adjacent structures,  leak of surgical connections, which can lead to other surgeries and possibly an ostomy, possible need for other procedures, such as abscess drains in radiology, possible prolonged hospital stay, possible diarrhea from removal of part of the colon, possible constipation from narcotics, possible bowel, bladder or sexual dysfunction if having rectal surgery, prolonged fatigue/weakness or appetite loss, possible early recurrence of of disease, possible complications of their medical problems such as heart disease or arrhythmias or lung problems, death (less than 1%). I believe the patient understands and wishes to proceed with the surgery.

## 2021-07-07 NOTE — Anesthesia Postprocedure Evaluation (Signed)
Anesthesia Post Note  Patient: Justin Meadows  Procedure(s) Performed: XI ROBOT ASSISTED LOW ANTERIOR RESECTION (Abdomen) CYSTOSCOPY with FIREFLY INJECTION     Patient location during evaluation: PACU Anesthesia Type: General Level of consciousness: awake and alert Pain management: pain level controlled Vital Signs Assessment: post-procedure vital signs reviewed and stable Respiratory status: spontaneous breathing, nonlabored ventilation, respiratory function stable and patient connected to nasal cannula oxygen Cardiovascular status: blood pressure returned to baseline and stable Postop Assessment: no apparent nausea or vomiting Anesthetic complications: no   No notable events documented.  Last Vitals:  Vitals:   07/07/21 1150 07/07/21 1313  BP: (!) 165/99 (!) 152/94  Pulse: 69 66  Resp: 16 18  Temp: 36.5 C 36.7 C  SpO2: 100% 100%    Last Pain:  Vitals:   07/07/21 1313  TempSrc: Oral  PainSc:                  Barnet Glasgow

## 2021-07-07 NOTE — Op Note (Signed)
Preoperative diagnosis:  Pelvic Abscess   Diverticulitis Postoperative diagnosis:  Same   Procedure: Cystoscopy Instillation of ureteral firefly constrast   Surgeon: Ardis Hughs, MD   Anesthesia: General   Complications: None   Intraoperative findings: Short/soft bulbar stricture  EBL: Minimal   Specimens: None   Indication:  Justin Meadows  is a 46 y.o.  patient with  diverticular abscess.  Dr. Marcello Moores requested instillation of firefly to facilitate the dissection of the sigmoid colon.  After reviewing the management options for treatment, he elected to proceed with the above surgical procedure(s). We have discussed the potential benefits and risks of the procedure, side effects of the proposed treatment, the likelihood of the patient achieving the goals of the procedure, and any potential problems that might occur during the procedure or recuperation. Informed consent has been obtained.   Description of procedure:   The patient was taken to the operating room and general anesthesia was induced.  The patient was placed in the dorsal lithotomy position, prepped and draped in the usual sterile fashion, and preoperative antibiotics were administered. A preoperative time-out was performed.    A 21 French 30 degree cystoscope was gently passed through the patient's urethra into the bladder.  The bladder was subsequently emptied and then filled slowly up performing a 360 degrees cystoscopic evaluation.  This demonstrated orthotopic ureteral orifices, normal bladder mucosa.   I then advanced a 5 [place] open-ended ureteral catheter into the patient's left ureteral orifice and  I then advanced the catheter up into the proximal ureter and then slowly pulled back and injected 7.51ml of the firefly contrast.  Subsequently turned my attention to the patient's right ureteral orifice and performed a similar task.   I then  placed a 16 [place] Foley.    The surgery was then turned over to  Dr. Marcello Moores for facilitation of the remainder of the case.

## 2021-07-07 NOTE — Op Note (Signed)
07/07/2021  10:23 AM  PATIENT:  Justin Meadows  46 y.o. male  Patient Care Team: Pcp, No as PCP - General  PRE-OPERATIVE DIAGNOSIS:  diverticular disease  POST-OPERATIVE DIAGNOSIS:  diverticular disease  PROCEDURE:  Procedure(s): XI ROBOT ASSISTED LOW ANTERIOR RESECTION CYSTOSCOPY with FIREFLY INJECTION  SURGEON:  Surgeon(s): Romie Levee, MD Andria Meuse, MD Crist Fat, MD  ASSISTANT: Dr Cliffton Asters   ANESTHESIA:   local and general  EBL: 42ml Total I/O In: 1100 [I.V.:1000; IV Piggyback:100] Out: 550 [Urine:500; Blood:50]  Delay start of Pharmacological VTE agent (>24hrs) due to surgical blood loss or risk of bleeding:  no  DRAINS: none   SPECIMEN:  Source of Specimen:  rectosigmoid colon  DISPOSITION OF SPECIMEN:  PATHOLOGY  COUNTS:  YES  PLAN OF CARE: Admit to inpatient   PATIENT DISPOSITION:  PACU - hemodynamically stable.  INDICATION:    46 y.o. M with pelvic diverticulitis with transgluteal transcolonic drain.  I recommended segmental resection:  The anatomy & physiology of the digestive tract was discussed.  The pathophysiology was discussed.  Natural history risks without surgery was discussed.   I worked to give an overview of the disease and the frequent need to have multispecialty involvement.  I feel the risks of no intervention will lead to serious problems that outweigh the operative risks; therefore, I recommended a partial colectomy to remove the pathology.  Laparoscopic & open techniques were discussed.   Risks such as bleeding, infection, abscess, leak, reoperation, possible ostomy, hernia, heart attack, death, and other risks were discussed.  I noted a good likelihood this will help address the problem.   Goals of post-operative recovery were discussed as well.    The patient expressed understanding & wished to proceed with surgery.  OR FINDINGS:   Patient had drain inside distal sigmoid colon, which was densely adherent in the  left pelvis  No obvious metastatic disease on visceral parietal peritoneum or liver.  The anastomosis rests ~11 cm from the anal verge by rigid proctoscopy.  DESCRIPTION:   Informed consent was confirmed.  The patient underwent general anaesthesia without difficulty.  The patient was positioned appropriately.  VTE prevention in place.  The patient's abdomen was clipped, prepped, & draped in a sterile fashion.  Surgical timeout confirmed our plan.  The patient was positioned in reverse Trendelenburg.  Abdominal entry was gained using a Varies needle in the LUQ.  Entry was clean.  I induced carbon dioxide insufflation.  An 65mm robotic port was placed in the RUQ.  Camera inspection revealed no injury.  Extra ports were carefully placed under direct laparoscopic visualization.  I laparoscopically reflected the greater omentum and the upper abdomen the small bowel in the upper abdomen. The patient was appropriately positioned and the robot was docked to the patient's left side.  Instruments were placed under direct visualization.    I mobilized the sigmoid colon off of the pelvic sidewall.  There were dense adhesions in the left pelvis.  I was not able to completely mobilize the sigmoid out of the pelvis.  I could see the left ureter via the firefly.  I scored the base of peritoneum of the right side of the mesentery of the left colon from the ligament of Treitz to the peritoneal reflection of the mid rectum.  The patient had significant adhesions posteriorly as well.   I elevated the sigmoid mesentery and enetered into the retro-mesenteric plane. We were able to identify the left ureter and gonadal vessels.  We kept those posterior within the retroperitoneum and elevated the left colon mesentery off that. I did isolated IMA but did not ligate it yet.  I continued distally and got into the avascular plane posterior to the mesorectum. This allowed me to help mobilize the rectum as well by freeing the mesorectum  off the sacrum.  I mobilized the peritoneal coverings towards the peritoneal reflection on both the right and left sides of the rectum.  I identified the rectum in the pelvis and divided the remaining scar laterally using the vessel sealer.  I could see the right and left ureters and stayed away from them. I was finally able to mobilize the sigmoid out of the pelvis after dividing the drain with the vessel sealer.     I skeletonized the inferior mesenteric artery pedicle.  I isolated the inferior mesenteric vein off of the ligament of Treitz just cephalad to that as well.  After confirming the left ureter was out of the way, I went ahead and ligated the inferior mesenteric artery and sigmoid artery with bipolar robotic vessel sealer well above its takeoff from the aorta.   We ensured hemostasis. I skeletonized the mesorectum at the proximal rectum using blunt dissection & bipolar robotic vessel sealer.  I mobilized the left colon in a lateral to medial fashion off the line of Toldt up towards the splenic flexure to ensure good mobilization of the left colon to reach into the pelvis.  I then divided the proximal rectum using a green load robotic stapler x2.  At this point the robot was undocked.  The suprapubic port was converted to a Pfannenstiel incision and an Alexis wound protector was placed.  The colon was brought out through the wound and divided proximally over a pursestring device.  A 2-0 Prolene pursestring was placed.  This was secured with 3-0 silk sutures and the pursestring was then tied tightly around a 29 mm EEA anvil.  This was then placed back into the pelvis.  I removed the CT-guided drain from the pelvis externally.  The rectum was dilated gently.  I was able to pass a 29 mm EEA stapler to the proximal rectum.  An anastomosis was created in end-to-end fashion.  There was no tension noted on the anastomosis.  There was no leak when tested with insufflation under irrigation.  The anastomosis  rest approximately 11 cm from the anal sphincter complex.  We irrigated the pelvis.  Hemostasis was good.  We then remove the ports and switched to clean gowns, gloves, instruments and drapes.  The Pfannenstiel incision peritoneum was closed using a running 0 Vicryl suture.  The fascia was closed using 2, running #1 Novafil sutures.  The subcutaneous tissue was reapproximated using a running 2-0 Vicryl suture and the skin was closed using a running 4-0 Vicryl suture.  A sterile dressing was applied.  The patient was then awakened from anesthesia and sent to the postanesthesia care unit in stable condition.  All counts were correct per operating room staff.  An MD assistant was necessary for tissue manipulation, retraction and positioning due to the complexity of the case and hospital policies

## 2021-07-07 NOTE — Transfer of Care (Signed)
Immediate Anesthesia Transfer of Care Note  Patient: Justin Meadows  Procedure(s) Performed: XI ROBOT ASSISTED LOW ANTERIOR RESECTION (Abdomen) CYSTOSCOPY with FIREFLY INJECTION  Patient Location: PACU  Anesthesia Type:General  Level of Consciousness: awake, alert , oriented and patient cooperative  Airway & Oxygen Therapy: Patient Spontanous Breathing and Patient connected to face mask oxygen  Post-op Assessment: Report given to RN, Post -op Vital signs reviewed and stable and Patient moving all extremities  Post vital signs: Reviewed and stable  Last Vitals:  Vitals Value Taken Time  BP 166/118 07/07/21 1045  Temp    Pulse 94 07/07/21 1045  Resp 21 07/07/21 1045  SpO2 100 % 07/07/21 1045  Vitals shown include unvalidated device data.  Last Pain:  Vitals:   07/07/21 0558  TempSrc: Oral         Complications: No notable events documented.

## 2021-07-08 ENCOUNTER — Other Ambulatory Visit: Payer: Self-pay

## 2021-07-08 LAB — CBC
HCT: 42.1 % (ref 39.0–52.0)
Hemoglobin: 13.9 g/dL (ref 13.0–17.0)
MCH: 29.8 pg (ref 26.0–34.0)
MCHC: 33 g/dL (ref 30.0–36.0)
MCV: 90.1 fL (ref 80.0–100.0)
Platelets: 277 10*3/uL (ref 150–400)
RBC: 4.67 MIL/uL (ref 4.22–5.81)
RDW: 14 % (ref 11.5–15.5)
WBC: 12.5 10*3/uL — ABNORMAL HIGH (ref 4.0–10.5)
nRBC: 0 % (ref 0.0–0.2)

## 2021-07-08 LAB — BASIC METABOLIC PANEL
Anion gap: 7 (ref 5–15)
BUN: 11 mg/dL (ref 6–20)
CO2: 28 mmol/L (ref 22–32)
Calcium: 8.9 mg/dL (ref 8.9–10.3)
Chloride: 100 mmol/L (ref 98–111)
Creatinine, Ser: 1.27 mg/dL — ABNORMAL HIGH (ref 0.61–1.24)
GFR, Estimated: >60 mL/min (ref >60)
Glucose, Bld: 118 mg/dL — ABNORMAL HIGH (ref 70–99)
Potassium: 4.1 mmol/L (ref 3.5–5.1)
Sodium: 135 mmol/L (ref 135–145)

## 2021-07-08 LAB — SURGICAL PATHOLOGY

## 2021-07-08 MED ORDER — CHLORHEXIDINE GLUCONATE CLOTH 2 % EX PADS
6.0000 | MEDICATED_PAD | Freq: Every day | CUTANEOUS | Status: DC
  Administered 2021-07-08: 6 via TOPICAL

## 2021-07-08 MED ORDER — HYDROCODONE-ACETAMINOPHEN 5-325 MG PO TABS
1.0000 | ORAL_TABLET | Freq: Four times a day (QID) | ORAL | Status: DC | PRN
  Administered 2021-07-08 – 2021-07-09 (×3): 2 via ORAL
  Filled 2021-07-08 (×3): qty 2

## 2021-07-08 NOTE — Progress Notes (Signed)
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  Denies nausea, having flatus and BM's.  Tolerating fulls  Objective: Vital signs in last 24 hours: Temp:  [97.8 F (36.6 C)-99.5 F (37.5 C)] 98.3 F (36.8 C) (01/20 0932) Pulse Rate:  [54-67] 62 (01/20 0932) Resp:  [16-19] 19 (01/20 0932) BP: (120-152)/(76-108) 130/76 (01/20 0932) SpO2:  [98 %-100 %] 98 % (01/20 0932) Weight:  [69.8 kg] 69.8 kg (01/20 0500)   Intake/Output from previous day: 01/19 0701 - 01/20 0700 In: 2640 [P.O.:220; I.V.:2320; IV Piggyback:100] Out: 2050 [Urine:2000; Blood:50] Intake/Output this shift: Total I/O In: 360 [P.O.:360] Out: 200 [Urine:200]   General appearance: alert and cooperative GI: normal findings: soft, nondistended  Incision: no significant drainage  Lab Results:  Recent Labs    07/08/21 0348  WBC 12.5*  HGB 13.9  HCT 42.1  PLT 277   BMET Recent Labs    07/08/21 0348  NA 135  K 4.1  CL 100  CO2 28  GLUCOSE 118*  BUN 11  CREATININE 1.27*  CALCIUM 8.9   PT/INR No results for input(s): LABPROT, INR in the last 72 hours. ABG No results for input(s): PHART, HCO3 in the last 72 hours.  Invalid input(s): PCO2, PO2  MEDS, Scheduled  Chlorhexidine Gluconate Cloth  6 each Topical Daily   enoxaparin (LOVENOX) injection  40 mg Subcutaneous Q24H   feeding supplement  237 mL Oral BID BM   gabapentin  300 mg Oral BID   saccharomyces boulardii  250 mg Oral BID    Studies/Results: No results found.  Assessment: s/p Procedure(s): XI ROBOT ASSISTED LOW ANTERIOR RESECTION CYSTOSCOPY with FIREFLY INJECTION Patient Active Problem List   Diagnosis Date Noted   Diverticular disease 07/07/2021   Pelvic abscess in male [facility]) 11/15/2020   Abdominal pain 11/14/2020    Expected post op course  Plan: Advance diet to soft foods Ambulate in hall Cont IVF's today D/c foley in AM Recheck labs in  AM Possible d/c Sat or Sun   LOS: 1 day     .Vanita Panda, MD [facility], [place]    07/08/2021 12:20 PM

## 2021-07-08 NOTE — Progress Notes (Signed)
Transition of Care [facility]) Screening Note  Patient Details  Name: Mutasim Tuckey Date of Birth: 07/30/1975  Transition of Care [facility]) CM/SW Contact:    Ewing Schlein, LCSW Phone Number: 07/08/2021, 11:16 AM  Transition of Care Department [facility]) has reviewed patient and no TOC needs have been identified at this time. We will continue to monitor patient advancement through interdisciplinary progression rounds. If new patient transition needs arise, please place a TOC consult.

## 2021-07-08 NOTE — Progress Notes (Signed)
Pharmacy Brief Note - Alvimopan (Entereg)  The standing order set for alvimopan (Entereg) now includes an automatic order to discontinue the drug after the patient has had a bowel movement. The change was approved by the [place] and the Medical Executive Committee.   This patient has had bowel movements documented by nursing. Therefore, alvimopan has been discontinued. If there are questions, please contact the pharmacy at [phone].   Thank you-   Royetta Asal, PharmD, BCPS 07/08/2021 10:51 AM

## 2021-07-08 NOTE — Plan of Care (Signed)
  Problem: Education: Goal: Knowledge of General Education information will improve Description Including pain rating scale, medication(s)/side effects and non-pharmacologic comfort measures Outcome: Progressing   

## 2021-07-08 NOTE — Discharge Instructions (Signed)
SURGERY: POST OP INSTRUCTIONS (Surgery for small bowel obstruction, colon resection, etc)   ######################################################################  EAT Gradually transition to a high fiber diet with a fiber supplement over the next few days after discharge  WALK Walk an hour a day.  Control your pain to do that.    CONTROL PAIN Control pain so that you can walk, sleep, tolerate sneezing/coughing, go up/down stairs.  HAVE A BOWEL MOVEMENT DAILY Keep your bowels regular to avoid problems.  OK to try a laxative to override constipation.  OK to use an antidairrheal to slow down diarrhea.  Call if not better after 2 tries  CALL IF YOU HAVE PROBLEMS/CONCERNS Call if you are still struggling despite following these instructions. Call if you have concerns not answered by these instructions  ######################################################################   DIET Follow a light diet the first few days at home.  Start with a bland diet such as soups, liquids, starchy foods, low fat foods, etc.  If you feel full, bloated, or constipated, stay on a ful liquid or pureed/blenderized diet for a few days until you feel better and no longer constipated. Be sure to drink plenty of fluids every day to avoid getting dehydrated (feeling dizzy, not urinating, etc.). Gradually add a fiber supplement to your diet over the next week.  Gradually get back to a regular solid diet.  Avoid fast food or heavy meals the first week as you are more likely to get nauseated. It is expected for your digestive tract to need a few months to get back to normal.  It is common for your bowel movements and stools to be irregular.  You will have occasional bloating and cramping that should eventually fade away.  Until you are eating solid food normally, off all pain medications, and back to regular activities; your bowels will not be normal. Focus on eating a low-fat, high fiber diet the rest of your life  (See Getting to Good Bowel Health, below).  CARE of your INCISION or WOUND  It is good for closed incisions and even open wounds to be washed every day.  Shower every day.  Short baths are fine.  Wash the incisions and wounds clean with soap & water.    You may leave closed incisions open to air if it is dry.   You may cover the incision with clean gauze & replace it after your daily shower for comfort.  STAPLES: You have skin staples.  Leave them in place & set up an appointment for them to be removed by a surgery office nurse ~10 days after surgery. = 1st week of January 2024    ACTIVITIES as tolerated Start light daily activities --- self-care, walking, climbing stairs-- beginning the day after surgery.  Gradually increase activities as tolerated.  Control your pain to be active.  Stop when you are tired.  Ideally, walk several times a day, eventually an hour a day.   Most people are back to most day-to-day activities in a few weeks.  It takes 4-8 weeks to get back to unrestricted, intense activity. If you can walk 30 minutes without difficulty, it is safe to try more intense activity such as jogging, treadmill, bicycling, low-impact aerobics, swimming, etc. Save the most intensive and strenuous activity for last (Usually 4-8 weeks after surgery) such as sit-ups, heavy lifting, contact sports, etc.  Refrain from any intense heavy lifting or straining until you are off narcotics for pain control.  You will have off days, but things should improve   week-by-week. DO NOT PUSH THROUGH PAIN.  Let pain be your guide: If it hurts to do something, don't do it.  Pain is your body warning you to avoid that activity for another week until the pain goes down. You may drive when you are no longer taking narcotic prescription pain medication, you can comfortably wear a seatbelt, and you can safely make sudden turns/stops to protect yourself without hesitating due to pain. You may have sexual intercourse when it  is comfortable. If it hurts to do something, stop.  MEDICATIONS Take your usually prescribed home medications unless otherwise directed.   Blood thinners:  Usually you can restart any strong blood thinners after the second postoperative day.  It is OK to take aspirin right away.     If you are on strong blood thinners (warfarin/Coumadin, Plavix, Xerelto, Eliquis, Pradaxa, etc), discuss with your surgeon, medicine PCP, and/or cardiologist for instructions on when to restart the blood thinner & if blood monitoring is needed (PT/INR blood check, etc).     PAIN CONTROL Pain after surgery or related to activity is often due to strain/injury to muscle, tendon, nerves and/or incisions.  This pain is usually short-term and will improve in a few months.  To help speed the process of healing and to get back to regular activity more quickly, DO THE FOLLOWING THINGS TOGETHER: Increase activity gradually.  DO NOT PUSH THROUGH PAIN Use Ice and/or Heat Try Gentle Massage and/or Stretching Take over the counter pain medication Take Narcotic prescription pain medication for more severe pain  Good pain control = faster recovery.  It is better to take more medicine to be more active than to stay in bed all day to avoid medications.  Increase activity gradually Avoid heavy lifting at first, then increase to lifting as tolerated over the next 6 weeks. Do not "push through" the pain.  Listen to your body and avoid positions and maneuvers than reproduce the pain.  Wait a few days before trying something more intense Walking an hour a day is encouraged to help your body recover faster and more safely.  Start slowly and stop when getting sore.  If you can walk 30 minutes without stopping or pain, you can try more intense activity (running, jogging, aerobics, cycling, swimming, treadmill, sex, sports, weightlifting, etc.) Remember: If it hurts to do it, then don't do it! Use Ice and/or Heat You will have swelling and  bruising around the incisions.  This will take several weeks to resolve. Ice packs or heating pads (6-8 times a day, 30-60 minutes at a time) will help sooth soreness & bruising. Some people prefer to use ice alone, heat alone, or alternate between ice & heat.  Experiment and see what works best for you.  Consider trying ice for the first few days to help decrease swelling and bruising; then, switch to heat to help relax sore spots and speed recovery. Shower every day.  Short baths are fine.  It feels good!  Keep the incisions and wounds clean with soap & water.   Try Gentle Massage and/or Stretching Massage at the area of pain many times a day Stop if you feel pain - do not overdo it Take over the counter pain medication This helps the muscle and nerve tissues become less irritable and calm down faster Choose ONE of the following over-the-counter anti-inflammatory medications: Acetaminophen 500mg tabs (Tylenol) 1-2 pills with every meal and just before bedtime (avoid if you have liver problems or if you have   acetaminophen in you narcotic prescription) Naproxen 220mg tabs (ex. Aleve, Naprosyn) 1-2 pills twice a day (avoid if you have kidney, stomach, IBD, or bleeding problems) Ibuprofen 200mg tabs (ex. Advil, Motrin) 3-4 pills with every meal and just before bedtime (avoid if you have kidney, stomach, IBD, or bleeding problems) Take with food/snack several times a day as directed for at least 2 weeks to help keep pain / soreness down & more manageable. Take Narcotic prescription pain medication for more severe pain A prescription for strong pain control is often given to you upon discharge (for example: oxycodone/Percocet, hydrocodone/Norco/Vicodin, or tramadol/Ultram) Take your pain medication as prescribed. Be mindful that most narcotic prescriptions contain Tylenol (acetaminophen) as well - avoid taking too much Tylenol. If you are having problems/concerns with the prescription medicine (does  not control pain, nausea, vomiting, rash, itching, etc.), please call us [phone] to see if we need to switch you to a different pain medicine that will work better for you and/or control your side effects better. If you need a refill on your pain medication, you must call the office before 4 pm and on weekdays only.  By federal law, prescriptions for narcotics cannot be called into a pharmacy.  They must be filled out on paper & picked up from our office by the patient or authorized caretaker.  Prescriptions cannot be filled after 4 pm nor on weekends.    WHEN TO CALL US [phone] Severe uncontrolled or worsening pain  Fever over 101 F (38.5 C) Concerns with the incision: Worsening pain, redness, rash/hives, swelling, bleeding, or drainage Reactions / problems with new medications (itching, rash, hives, nausea, etc.) Nausea and/or vomiting Difficulty urinating Difficulty breathing Worsening fatigue, dizziness, lightheadedness, blurred vision Other concerns If you are not getting better after two weeks or are noticing you are getting worse, contact our office [phone] for further advice.  We may need to adjust your medications, re-evaluate you in the office, send you to the emergency room, or see what other things we can do to help. The clinic staff is available to answer your questions during regular business hours (8:30am-5pm).  Please don't hesitate to call and ask to speak to one of our nurses for clinical concerns.    A surgeon from [facility] is always on call at the hospitals 24 hours/day If you have a medical emergency, go to the nearest emergency room or call 911.  FOLLOW UP in our office One the day of your discharge from the hospital (or the next business weekday), please call [facility] to set up or confirm an appointment to see your surgeon in the office for a follow-up appointment.  Usually it is 2-3 weeks after your surgery.   If you  have skin staples at your incision(s), let the office know so we can set up a time in the office for the nurse to remove them (usually around 10 days after surgery). Make sure that you call for appointments the day of discharge (or the next business weekday) from the hospital to ensure a convenient appointment time. IF YOU HAVE DISABILITY OR FAMILY LEAVE FORMS, BRING THEM TO THE OFFICE FOR PROCESSING.  DO NOT GIVE THEM TO YOUR DOCTOR.  [facility] [address], [address], [place], [place]  [address] ? [phone] - Main [phone] - Toll Free,  [phone] - Fax [email or web address]    GETTING TO GOOD BOWEL HEALTH. It is expected for your digestive tract to   need a few months to get back to normal.  It is common for your bowel movements and stools to be irregular.  You will have occasional bloating and cramping that should eventually fade away.  Until you are eating solid food normally, off all pain medications, and back to regular activities; your bowels will not be normal.   Avoiding constipation The goal: ONE SOFT BOWEL MOVEMENT A DAY!    Drink plenty of fluids.  Choose water first. TAKE A FIBER SUPPLEMENT EVERY DAY THE REST OF YOUR LIFE During your first week back home, gradually add back a fiber supplement every day Experiment which form you can tolerate.   There are many forms such as powders, tablets, wafers, gummies, etc Psyllium bran (Metamucil), methylcellulose (Citrucel), Miralax or Glycolax, Benefiber, Flax Seed.  Adjust the dose week-by-week (1/2 dose/day to 6 doses a day) until you are moving your bowels 1-2 times a day.  Cut back the dose or try a different fiber product if it is giving you problems such as diarrhea or bloating. Sometimes a laxative is needed to help jump-start bowels if constipated until the fiber supplement can help regulate your bowels.  If you are tolerating eating & you are farting, it is okay to try a gentle  laxative such as double dose MiraLax, prune juice, or Milk of Magnesia.  Avoid using laxatives too often. Stool softeners can sometimes help counteract the constipating effects of narcotic pain medicines.  It can also cause diarrhea, so avoid using for too long. If you are still constipated despite taking fiber daily, eating solids, and a few doses of laxatives, call our office. Controlling diarrhea Try drinking liquids and eating bland foods for a few days to avoid stressing your intestines further. Avoid dairy products (especially milk & ice cream) for a short time.  The intestines often can lose the ability to digest lactose when stressed. Avoid foods that cause gassiness or bloating.  Typical foods include beans and other legumes, cabbage, broccoli, and dairy foods.  Avoid greasy, spicy, fast foods.  Every person has some sensitivity to other foods, so listen to your body and avoid those foods that trigger problems for you. Probiotics (such as active yogurt, Align, etc) may help repopulate the intestines and colon with normal bacteria and calm down a sensitive digestive tract Adding a fiber supplement gradually can help thicken stools by absorbing excess fluid and retrain the intestines to act more normally.  Slowly increase the dose over a few weeks.  Too much fiber too soon can backfire and cause cramping & bloating. It is okay to try and slow down diarrhea with a few doses of antidiarrheal medicines.   Bismuth subsalicylate (ex. Kayopectate, Pepto Bismol) for a few doses can help control diarrhea.  Avoid if pregnant.   Loperamide (Imodium) can slow down diarrhea.  Start with one tablet (2mg) first.  Avoid if you are having fevers or severe pain.  ILEOSTOMY PATIENTS WILL HAVE CHRONIC DIARRHEA since their colon is not in use.    Drink plenty of liquids.  You will need to drink even more glasses of water/liquid a day to avoid getting dehydrated. Record output from your ileostomy.  Expect to empty  the bag every 3-4 hours at first.  Most people with a permanent ileostomy empty their bag 4-6 times at the least.   Use antidiarrheal medicine (especially Imodium) several times a day to avoid getting dehydrated.  Start with a dose at bedtime & breakfast.  Adjust up or   down as needed.  Increase antidiarrheal medications as directed to avoid emptying the bag more than 8 times a day (every 3 hours). Work with your wound ostomy nurse to learn care for your ostomy.  See ostomy care instructions. TROUBLESHOOTING IRREGULAR BOWELS 1) Start with a soft & bland diet. No spicy, greasy, or fried foods.  2) Avoid gluten/wheat or dairy products from diet to see if symptoms improve. 3) Miralax 17gm or flax seed mixed in 8oz. water or juice-daily. May use 2-4 times a day as needed. 4) Gas-X, Phazyme, etc. as needed for gas & bloating.  5) Prilosec (omeprazole) over-the-counter as needed 6)  Consider probiotics (Align, Activa, etc) to help calm the bowels down  Call your doctor if you are getting worse or not getting better.  Sometimes further testing (cultures, endoscopy, X-ray studies, CT scans, bloodwork, etc.) may be needed to help diagnose and treat the cause of the diarrhea. [facility] [address], [address], [place], [place]  [address] [phone] - Main.    [phone]  - Toll Free.   [phone] - Fax [email or web address]   ###############################   #######################################################  Ostomy Support Information  You've heard that people get along just fine with only one of their eyes, or one of their lungs, or one of their kidneys. But you also know that you have only one intestine and only one bladder, and that leaves you feeling awfully empty, both physically and emotionally: You think no other people go around without part of their intestine with the ends of their intestines sticking out through their abdominal walls.    YOU ARE NOT ALONE.  There are nearly three quarters of a million people in the [place] who have an ostomy; people who have had surgery to remove all or part of their colons or bladders.   There is even a national association, the [employer] with over 350 local affiliated support groups that are organized by volunteers who provide peer support and counseling. [employer] has a toll free telephone num-ber, 800-826-0826 and an educational, interactive website, www.ostomy.org   An ostomy is an opening in the belly (abdominal wall) made by surgery. Ostomates are people who have had this procedure. The opening (stoma) allows the kidney or bowel to grdischarge waste. An external pouch covers the stoma to collect waste. Pouches are are a simple bag and are odor free. Different companies have disposable or reusable pouches to fit one's lifestyle. An ostomy can either be temporary or permanent.   THERE ARE THREE MAIN TYPES OF OSTOMIES Colostomy. A colostomy is a surgically created opening in the large intestine (colon). Ileostomy. An ileostomy is a surgically created opening in the small intestine. Urostomy. A urostomy is a surgically created opening to divert urine away from the bladder.  OSTOMY Care  The following guidelines will make care of your colostomy easier. Keep this information close by for quick reference.  Helpful DIET hints Eat a well-balanced diet including vegetables and fresh fruits. Eat on a regular schedule.  Drink at least 6 to 8 glasses of fluids daily. Eat slowly in a relaxed atmosphere. Chew your food thoroughly. Avoid chewing gum, smoking, and drinking from a straw. This will help decrease the amount of air you swallow, which may help reduce gas. Eating yogurt or drinking buttermilk may help reduce gas.  To control gas at night, do not eat after 8 p.m. This will give your bowel time to quiet down before you go   to bed.  If gas is a problem, you can purchase  Beano. Sprinkle Beano on the first bite of food before eating to reduce gas. It has no flavor and should not change the taste of your food. You can buy Beano over the counter at your local drugstore.  Foods like fish, onions, garlic, broccoli, asparagus, and cabbage produce odor. Although your pouch is odor-proof, if you eat these foods you may notice a stronger odor when emptying your pouch. If this is a concern, you may want to limit these foods in your diet.  If you have an ileostomy, you will have chronic diarrhea & need to drink more liquids to avoid getting dehydrated.  Consider antidiarrheal medicine like imodium (loperamide) or Lomotil to help slow down bowel movements / diarrhea into your ileostomy bag.  GETTING TO GOOD BOWEL HEALTH WITH AN ILEOSTOMY    With the colon bypassed & not in use, you will have small bowel diarrhea.   It is important to thicken & slow your bowel movements down.   The goal: 4-6 small BOWEL MOVEMENTS A DAY It is important to drink plenty of liquids to avoid getting dehydrated  CONTROLLING ILEOSTOMY DIARRHEA  TAKE A FIBER SUPPLEMENT (FiberCon or Benefiner soluble fiber) twice a day - to thicken stools by absorbing excess fluid and retrain the intestines to act more normally.  Slowly increase the dose over a few weeks.  Too much fiber too soon can backfire and cause cramping & bloating.  TAKE AN IRON SUPPLEMENT twice a day to naturally constipate your bowels.  Usually ferrous sulfate 325mg twice a day)  TAKE ANTI-DIARRHEAL MEDICINES: Loperamide (Imodium) can slow down diarrhea.  Start with two tablets (= 4mg) first and then try one tablet every 6 hours.  Can go up to 2 pills four times day (8 pills of 2mg max) Avoid if you are having fevers or severe pain.  If you are not better or start feeling worse, stop all medicines and call your doctor for advice LoMotil (Diphenoxylate / Atropine) is another medicine that can constipate & slow down bowel moevements Pepto  Bismol (bismuth) can gently thicken bowels as well  If diarrhea is worse,: drink plenty of liquids and try simpler foods for a few days to avoid stressing your intestines further. Avoid dairy products (especially milk & ice cream) for a short time.  The intestines often can lose the ability to digest lactose when stressed. Avoid foods that cause gassiness or bloating.  Typical foods include beans and other legumes, cabbage, broccoli, and dairy foods.  Every person has some sensitivity to other foods, so listen to our body and avoid those foods that trigger problems for you.Call your doctor if you are getting worse or not better.  Sometimes further testing (cultures, endoscopy, X-ray studies, bloodwork, etc) may be needed to help diagnose and treat the cause of the diarrhea. Take extra anti-diarrheal medicines (maximum is 8 pills of 2mg loperamide a day)   Tips for POUCHING an OSTOMY   Changing Your Pouch The best time to change your pouch is in the morning, before eating or drinking anything. Your stoma can function at any time, but it will function more after eating or drinking.   Applying the pouching system  Place all your equipment close at hand before removing your pouch.  Wash your hands.  Stand or sit in front of a mirror. Use the position that works best for you. Remember that you must keep the skin around the stoma   wrinkle-free for a good seal.  Gently remove the used pouch (1-piece system) or the pouch and old wafer (2-piece system). Empty the pouch into the toilet. Save the closure clip to use again.  Wash the stoma itself and the skin around the stoma. Your stoma may bleed a little when being washed. This is normal. Rinse and pat dry. You may use a wash cloth or soft paper towels (like Bounty), mild soap (like Dial, Safeguard, or Ivory), and water. Avoid soaps that contain perfumes or lotions.  For a new pouch (1-piece system) or a new wafer (2-piece system), measure your  stoma using the stoma guide in each box of supplies.  Trace the shape of your stoma onto the back of the new pouch or the back of the new wafer. Cut out the opening. Remove the paper backing and set it aside.  Optional: Apply a skin barrier powder to surrounding skin if it is irritated (bare or weeping), and dust off the excess. Optional: Apply a skin-prep wipe (such as Skin Prep or All-Kare) to the skin around the stoma, and let it dry. Do not apply this solution if the skin is irritated (red, tender, or broken) or if you have shaved around the stoma. Optional: Apply a skin barrier paste (such as Stomahesive, Coloplast, or Premium) around the opening cut in the back of the pouch or wafer. Allow it to dry for 30 to 60 seconds.  Hold the pouch (1-piece system) or wafer (2-piece system) with the sticky side toward your body. Make sure the skin around the stoma is wrinkle-free. Center the opening on the stoma, then press firmly to your abdomen (Fig. 4). Look in the mirror to check if you are placing the pouch, or wafer, in the right position. For a 2-piece system, snap the pouch onto the wafer. Make sure it snaps into place securely.  Place your hand over the stoma and the pouch or wafer for about 30 seconds. The heat from your hand can help the pouch or wafer stick to your skin.  Add deodorant (such as Super Banish or Nullo) to your pouch. Other options include food extracts such as vanilla oil and peppermint extract. Add about 10 drops of the deodorant to the pouch. Then apply the closure clamp. Note: Do not use toxic  chemicals or commercial cleaning agents in your pouch. These substances may harm the stoma.  Optional: For extra seal, apply tape to all 4 sides around the pouch or wafer, as if you were framing a picture. You may use any brand of medical adhesive tape. Change your pouch every 5 to 7 days. Change it immediately if a leak occurs.  Wash your hands afterwards.  If you are wearing a  2-piece system, you may use 2 new pouches per week and alternate them. Rinse the pouch with mild soap and warm water and hang it to dry for the next day. Apply the fresh pouch. Alternate the 2 pouches like this for a week. After a week, change the wafer and begin with 2 new pouches. Place the old pouches in a plastic bag, and put them in the trash.   LIVING WITH AN OSTOMY  Emptying Your Pouch Empty your pouch when it is one-third full (of urine, stool, and/or gas). If you wait until your pouch is fuller than this, it will be more difficult to empty and more noticeable. When you empty your pouch, either put toilet paper in the toilet bowl first, or flush the   toilet while you empty the pouch. This will reduce splashing. You can empty the pouch between your legs or to one side while sitting, or while standing or stooping. If you have a 2-piece system, you can snap off the pouch to empty it. Remember that your stoma may function during this time. If you wish to rinse your pouch after you empty it, a turkey baster can be helpful. When using a baster, squirt water up into the pouch through the opening at the bottom. With a 2-piece system, you can snap off the pouch to rinse it. After rinsing  your pouch, empty it into the toilet. When rinsing your pouch at home, put a few granules of Dreft soap in the rinse water. This helps lubricate and freshen your pouch. The inside of your pouch can be sprayed with non-stick cooking oil (Pam spray). This may help reduce stool sticking to the inside of the pouch.  Bathing You may shower or bathe with your pouch on or off. Remember that your stoma may function during this time.  The materials you use to wash your stoma and the skin around it should be clean, but they do not need to be sterile.  Wearing Your Pouch During hot weather, or if you perspire a lot in general, wear a cover over your pouch. This may prevent a rash on your skin under the pouch. Pouch covers are  sold at ostomy supply stores. Wear the pouch inside your underwear for better support. Watch your weight. Any gain or loss of 10 to 15 pounds or more can change the way your pouch fits.  Going Away From Home A collapsible cup (like those that come in travel kits) or a soft plastic squirt bottle with a pull-up top (like a travel bottle for shampoo) can be used for rinsing your pouch when you are away from home. Tilt the opening of the pouch at an upward angle when using a cup to rinse.  Carry wet wipes or extra tissues to use in public bathrooms.  Carry an extra pouching system with you at all times.  Never keep ostomy supplies in the glove compartment of your car. Extreme heat or cold can damage the skin barriers and adhesive wafers on the pouch.  When you travel, carry your ostomy supplies with you at all times. Keep them within easy reach. Do not pack ostomy supplies in baggage that will be checked or otherwise separated from you, because your baggage might be lost. If you're traveling out of the country, it is helpful to have a letter stating that you are carrying ostomy supplies as a medical necessity.  If you need ostomy supplies while traveling, look in the yellow pages of the telephone book under "Surgical Supplies." Or call the local ostomy organization to find out where supplies are available.  Do not let your ostomy supplies get low. Always order new pouches before you use the last one.  Reducing Odor Limit foods such as broccoli, cabbage, onions, fish, and garlic in your diet to help reduce odor. Each time you empty your pouch, carefully clean the opening of the pouch, both inside and outside, with toilet paper. Rinse your pouch 1 or 2 times daily after you empty it (see directions for emptying your pouch and going away from home). Add deodorant (such as Super Banish or Nullo) to your pouch. Use air deodorizers in your bathroom. Do not add aspirin to your pouch. Even though  aspirin can help prevent odor, it   could cause ulcers on your stoma.  When to call the doctor Call the doctor if you have any of the following symptoms: Purple, black, or white stoma Severe cramps lasting more than 6 hours Severe watery discharge from the stoma lasting more than 6 hours No output from the colostomy for 3 days Excessive bleeding from your stoma Swelling of your stoma to more than 1/2-inch larger than usual Pulling inward of your stoma below skin level Severe skin irritation or deep ulcers Bulging or other changes in your abdomen  When to call your ostomy nurse Call your ostomy/enterostomal therapy (WOCN) nurse if any of the following occurs: Frequent leaking of your pouching system Change in size or appearance of your stoma, causing discomfort or problems with your pouch Skin rash or rawness Weight gain or loss that causes problems with your pouch     FREQUENTLY ASKED QUESTIONS   Why haven't you met any of these folks who have an ostomy?  Well, maybe you have! You just did not recognize them because an ostomy doesn't show. It can be kept secret if you wish. Why, maybe some of your best friends, office associates or neighbors have an ostomy ... you never can tell. People facing ostomy surgery have many quality-of-life questions like: Will you bulge? Smell? Make noises? Will you feel waste leaving your body? Will you be a captive of the toilet? Will you starve? Be a social outcast? Get/stay married? Have babies? Easily bathe, go swimming, bend over?  OK, let's look at what you can expect:   Will you bulge?  Remember, without part of the intestine or bladder, and its contents, you should have a flatter tummy than before. You can expect to wear, with little exception, what you wore before surgery ... and this in-cludes tight clothing and bathing suits.   Will you smell?  Today, thanks to modern odor proof pouching systems, you can walk into an ostomy support group  meeting and not smell anything that is foul or offensive. And, for those with an ileostomy or colostomy who are concerned about odor when emptying their pouch, there are in-pouch deodorants that can be used to eliminate any waste odors that may exist.   Will you make noises?  Everyone produces gas, especially if they are an air-swallower. But intestinal sounds that occur from time to time are no differ-ent than a gurgling tummy, and quite often your clothing will muffle any sounds.   Will you feel the waste discharges?  For those with a colostomy or ileostomy there might be a slight pressure when waste leaves your body, but understand that the intestines have no nerve endings, so there will be no unpleasant sensations. Those with a urostomy will probably be unaware of any kidney drainage.   Will you be a captive of the toilet?  Immediately post-op you will spend more time in the bathroom than you will after your body recovers from surgery. Every person is different, but on average those with an ileostomy or urostomy may empty their pouches 4 to 6 times a day; a little  less if you have a colostomy. The average wear time between pouch system changes is 3 to 5 days and the changing process should take less than 30 minutes.   Will I need to be on a special diet? Most people return to their normal diet when they have recovered from surgery. Be sure to chew your food well, eat a well-balanced diet and drink plenty of fluids. If   you experience problems with a certain food, wait a couple of weeks and try it again.  Will there be odor and noises? Pouching systems are designed to be odor-proof or odor-resistant. There are deodorants that can be used in the pouch. Medications are also available to help reduce odor. Limit gas-producing foods and carbonated beverages. You will experience less gas and fewer noises as you heal from surgery.  How much time will it take to care for my ostomy? At first, you may  spend a lot of time learning about your ostomy and how to take care of it. As you become more comfortable and skilled at changing the pouching system, it will take very little time to care for it.   Will I be able to return to work? People with ostomies can perform most jobs. As soon as you have healed from surgery, you should be able to return to work. Heavy lifting (more than 10 pounds) may be discouraged.   What about intimacy? Sexual relationships and intimacy are important and fulfilling aspects of your life. They should continue after ostomy surgery. Intimacy-related concerns should be discussed openly between you and your partner.   Can I wear regular clothing? You do not need to wear special clothing. Ostomy pouches are fairly flat and barely noticeable. Elastic undergarments will not hurt the stoma or prevent the ostomy from functioning.   Can I participate in sports? An ostomy should not limit your involvement in sports. Many people with ostomies are runners, skiers, swimmers or participate in other active lifestyles. Talk with your caregiver first before doing heavy physical activity.  Will you starve?  Not if you follow doctor's orders at each stage of your post-op adjustment. There is no such thing as an "ostomy diet". Some people with an ostomy will be able to eat and tolerate anything; others may find diffi-culty with some foods. Each person is an individual and must determine, by trial, what is best for them. A good practice for all is to drink plenty of water.   Will you be a social outcast?  Have you met anyone who has an ostomy and is a social outcast? Why should you be the first? Only your attitude and self image will effect how you are treated. No confi-dent person is an outcast.    PROFESSIONAL HELP   Resources are available if you need help or have questions about your ostomy.   Specially trained nurses called Wound, Ostomy Continence Nurses (WOCN) are available for  consultation in most major medical centers.  Consider getting an ostomy consult at an outpatient ostomy clinic.    has an Ostomy Clinic run by an WOCN ostomy nurse at the [facility] campus.  [phone]. [facility] can help set up an appointment   The [employer] Association (UOA) is a group made up of many local chapters throughout the [place]. These local groups hold meetings and provide support to prospective and existing ostomates. They sponsor educational events and have qualified visitors to make personal or telephone visits. Contact the UOA for the chapter nearest you and for other educational publications.  More detailed information can be found in Colostomy Guide, a publication of the [employer] Ostomy Association (UOA). Contact UOA at 1-800-826-0826 or visit their web site at www.uoaa.org. The website contains links to other sites, suppliers and resources.  [facility] Secure Start Services: Start at the website to enlist for support.  Your Wound Ostomy (WOCN) nurse may have started this   process. [email or web address]://[email or web address].com/en/securestart Secure Start services are designed to support people as they live their lives with an ostomy or neurogenic bladder. Enrolling is easy and at no cost to the patient. We realize that each person's needs and life journey are different. Through Secure Start services, we want to help people live their life, their way.  #######################################################  

## 2021-07-09 ENCOUNTER — Other Ambulatory Visit (HOSPITAL_COMMUNITY): Payer: Self-pay

## 2021-07-09 LAB — CBC
HCT: 39.2 % (ref 39.0–52.0)
Hemoglobin: 13 g/dL (ref 13.0–17.0)
MCH: 30.4 pg (ref 26.0–34.0)
MCHC: 33.2 g/dL (ref 30.0–36.0)
MCV: 91.8 fL (ref 80.0–100.0)
Platelets: 253 10*3/uL (ref 150–400)
RBC: 4.27 MIL/uL (ref 4.22–5.81)
RDW: 14.2 % (ref 11.5–15.5)
WBC: 12.9 10*3/uL — ABNORMAL HIGH (ref 4.0–10.5)
nRBC: 0 % (ref 0.0–0.2)

## 2021-07-09 LAB — BASIC METABOLIC PANEL
Anion gap: 8 (ref 5–15)
BUN: 10 mg/dL (ref 6–20)
CO2: 28 mmol/L (ref 22–32)
Calcium: 9 mg/dL (ref 8.9–10.3)
Chloride: 102 mmol/L (ref 98–111)
Creatinine, Ser: 1.13 mg/dL (ref 0.61–1.24)
GFR, Estimated: >60 mL/min (ref >60)
Glucose, Bld: 113 mg/dL — ABNORMAL HIGH (ref 70–99)
Potassium: 3.9 mmol/L (ref 3.5–5.1)
Sodium: 138 mmol/L (ref 135–145)

## 2021-07-09 MED ORDER — HYDROCODONE-ACETAMINOPHEN 5-325 MG PO TABS
1.0000 | ORAL_TABLET | Freq: Four times a day (QID) | ORAL | 0 refills | Status: AC | PRN
Start: 2021-07-09 — End: ?
  Filled 2021-07-09: qty 15, 4d supply, fill #0

## 2021-07-09 NOTE — Progress Notes (Signed)
2 Days Post-Op   Subjective/Chief Complaint: Complains only of lower abd soreness. Tolerating diet and passing flatus   Objective: Vital signs in last 24 hours: Temp:  [97.6 F (36.4 C)-100 F (37.8 C)] 98.7 F (37.1 C) (01/21 0551) Pulse Rate:  [63-80] 74 (01/21 0551) Resp:  [16-18] 18 (01/21 0551) BP: (120-149)/(76-99) 131/84 (01/21 0551) SpO2:  [98 %-99 %] 98 % (01/21 0551) Weight:  [71.2 kg] 71.2 kg (01/20 1510) Last BM Date: 07/08/21  Intake/Output from previous day: 01/20 0701 - 01/21 0700 In: 3240.3 [P.O.:2280; I.V.:960.3] Out: 2350 [Urine:2350] Intake/Output this shift: No intake/output data recorded.  General appearance: alert and cooperative Resp: clear to auscultation bilaterally Cardio: regular rate and rhythm GI: soft, mild tenderness. Good bs  Lab Results:  Recent Labs    07/08/21 0348 07/09/21 0411  WBC 12.5* 12.9*  HGB 13.9 13.0  HCT 42.1 39.2  PLT 277 253   BMET Recent Labs    07/08/21 0348 07/09/21 0411  NA 135 138  K 4.1 3.9  CL 100 102  CO2 28 28  GLUCOSE 118* 113*  BUN 11 10  CREATININE 1.27* 1.13  CALCIUM 8.9 9.0   PT/INR No results for input(s): LABPROT, INR in the last 72 hours. ABG No results for input(s): PHART, HCO3 in the last 72 hours.  Invalid input(s): PCO2, PO2  Studies/Results: No results found.  Anti-infectives: Anti-infectives (From admission, onward)    Start     Dose/Rate Route Frequency Ordered Stop   07/07/21 0600  cefoTEtan (CEFOTAN) 2 g in sodium chloride 0.9 % 100 mL IVPB        2 g 200 mL/hr over 30 Minutes Intravenous On call to O.R. 07/07/21 0522 07/07/21 0809       Assessment/Plan: s/p Procedure(s): XI ROBOT ASSISTED LOW ANTERIOR RESECTION (N/A) CYSTOSCOPY with FIREFLY INJECTION (N/A) Advance diet Discharge  LOS: 2 days    Chevis Pretty III 07/09/2021

## 2021-07-09 NOTE — Progress Notes (Signed)
Foley Cath removed without complications.

## 2021-07-09 NOTE — TOC Transition Note (Signed)
Transition of Care [facility]) - CM/SW Discharge Note   Patient Details  Name: Justin Meadows MRN: [id number] Date of Birth: Jan 25, 1976  [facility] of [facility] CM/SW Contact:  Leeroy Cha, RN Phone Number: 07/09/2021, 11:38 AM   Clinical Narrative:    Dcd to home with self care. No toc needs present.   Final next level of care: Home/Self Care Barriers to Discharge: No Barriers Identified   Patient Goals and CMS Choice        Discharge Placement                       Discharge Plan and Services                                     Social Determinants of Health (SDOH) Interventions     Readmission Risk Interventions No flowsheet data found.

## 2021-07-09 NOTE — Progress Notes (Signed)
Assessment unchanged. Pt verbalized understanding of dc instructions through teach back including meds, diet and follow up care. Discharged via wc to front entrance accompanied by NT.

## 2021-07-09 NOTE — Plan of Care (Signed)

## 2021-07-11 ENCOUNTER — Other Ambulatory Visit (HOSPITAL_COMMUNITY): Payer: Self-pay

## 2021-07-11 NOTE — Discharge Summary (Signed)
Patient ID: Fallon Howerter [id number] y.o. 1975-10-27  07/07/2021  Discharge date and time: 07/09/2021 12:14 PM  Admitting Physician: Vanita Panda  Discharge Physician: Dr Carolynne Edouard  Admission Diagnoses: Diverticular disease [K57.90]  Discharge Diagnoses: Diverticular disease  Operations:  XI ROBOT ASSISTED LOW ANTERIOR RESECTION CYSTOSCOPY with FIREFLY INJECTION    Discharged Condition: good    Hospital Course: Patient was admitted to the med surg floor after surgery.  Diet was advanced as tolerated.  Patient began to have bowel function on postop day 1.  By postop day 2, he was tolerating a solid diet and pain was controlled with oral medications.  He was urinating without difficulty and ambulating without assistance.  Patient was felt to be in stable condition for discharge to home.   Consults: None  Significant Diagnostic Studies: labs: cbc, bmet  Treatments: IV hydration, analgesia: Vicodin, and surgery: robotic LAR  Disposition: Home

## 2021-07-27 IMAGING — RF DG SINUS / FISTULA TRACT / ABSCESSOGRAM
3 series · 9 of 9 positions shown · non-contrast
Comparison: none

INDICATION: Pelvic abscess status post percutaneous drain

[Series 1: one shot · 1 of 1 slices shown]
[im 1/1]
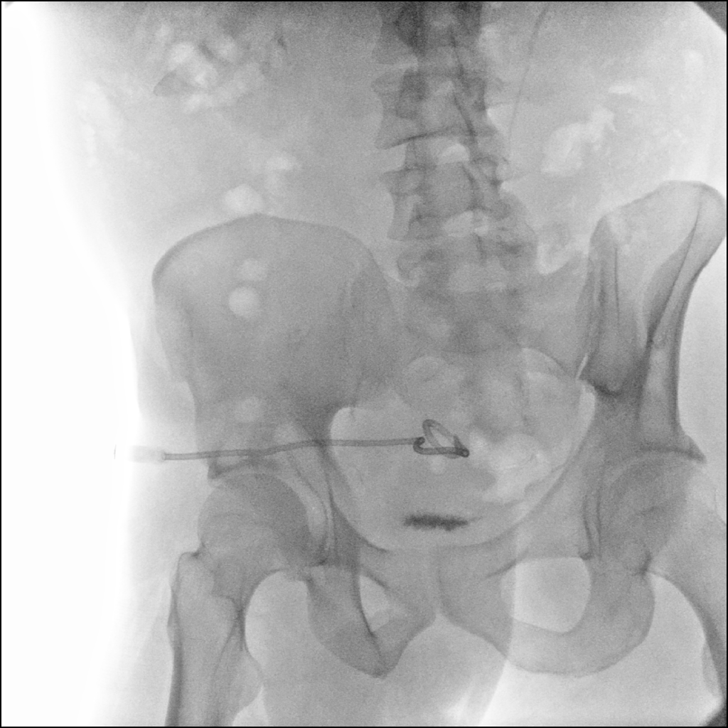

[Series 2: sequence · 4 of 112 frames shown (1 of 2)]
[frame 1/112]
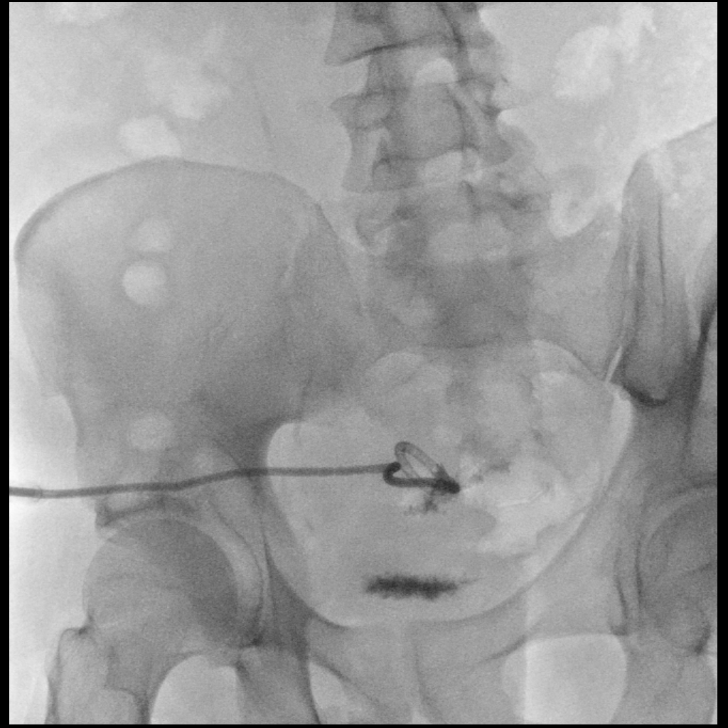
[frame 17/112]
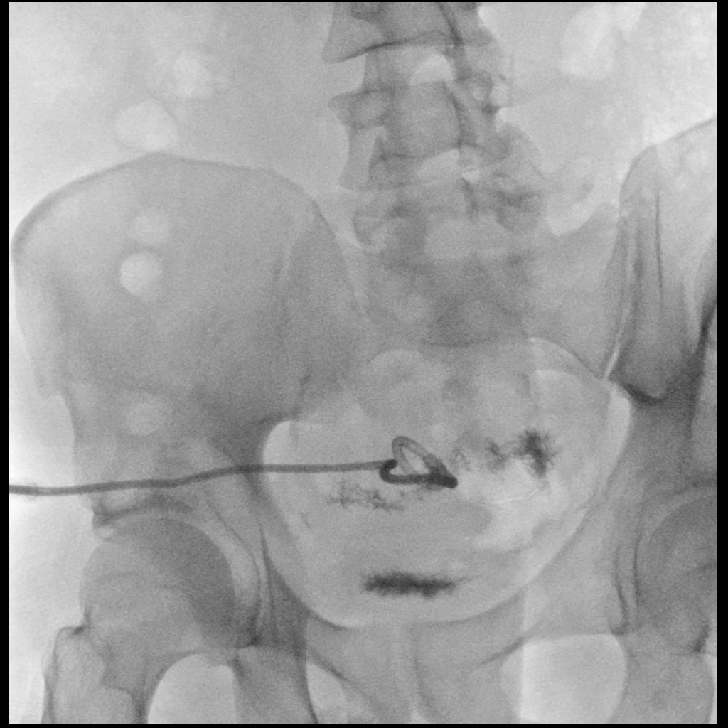
[frame 57/112]
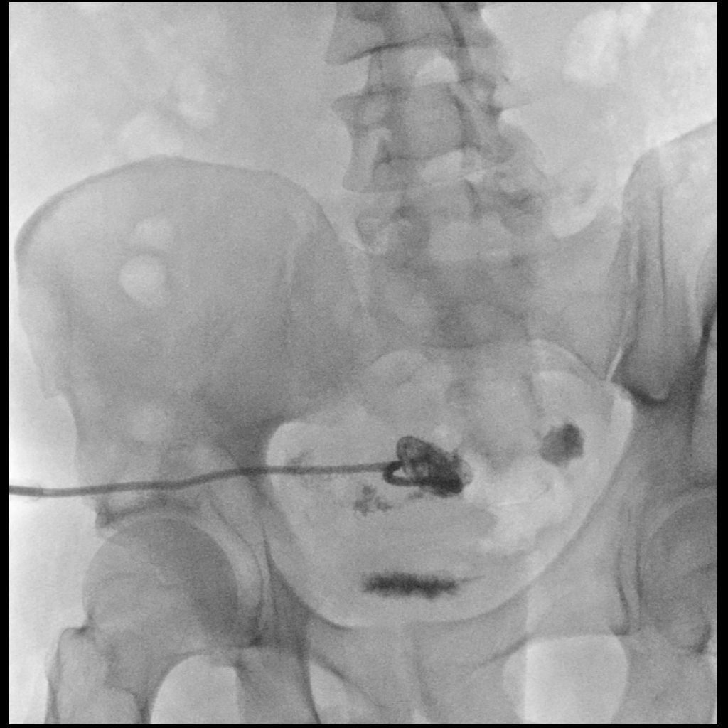
[frame 96/112]
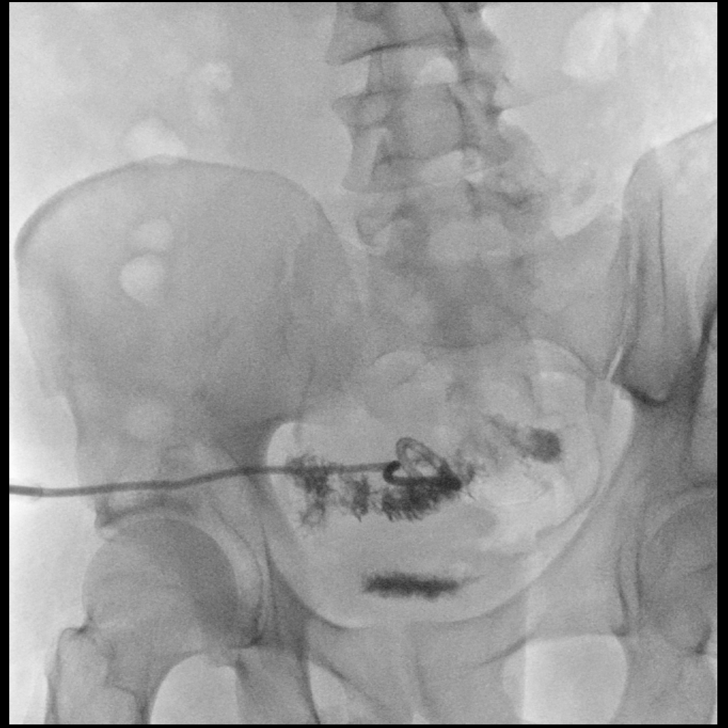

[Series 3: sequence · 4 of 19 frames shown (2 of 2)]
[frame 3/19]
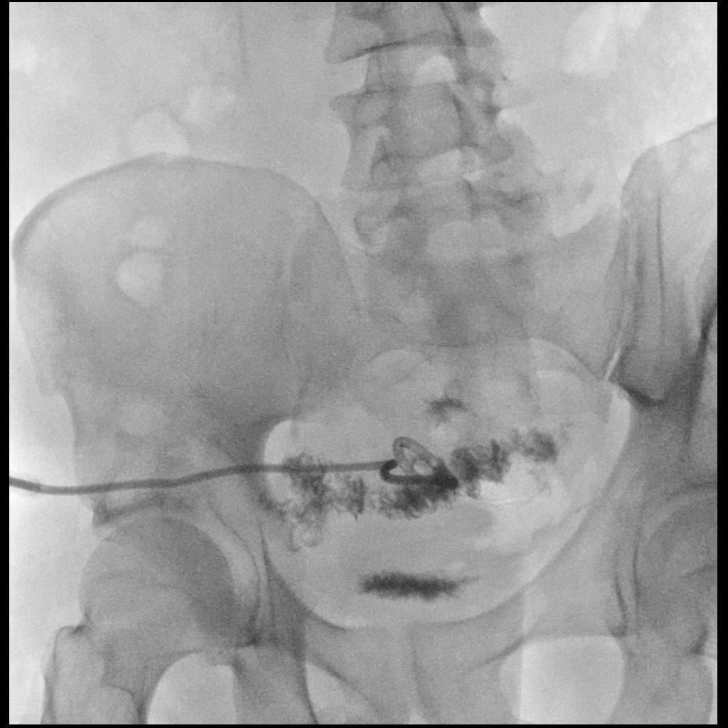
[frame 10/19]
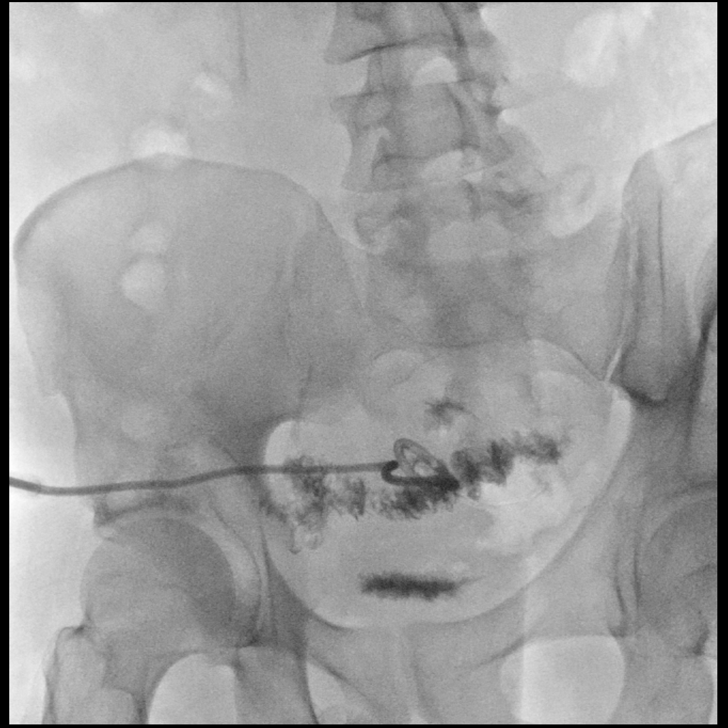
[frame 11/19]
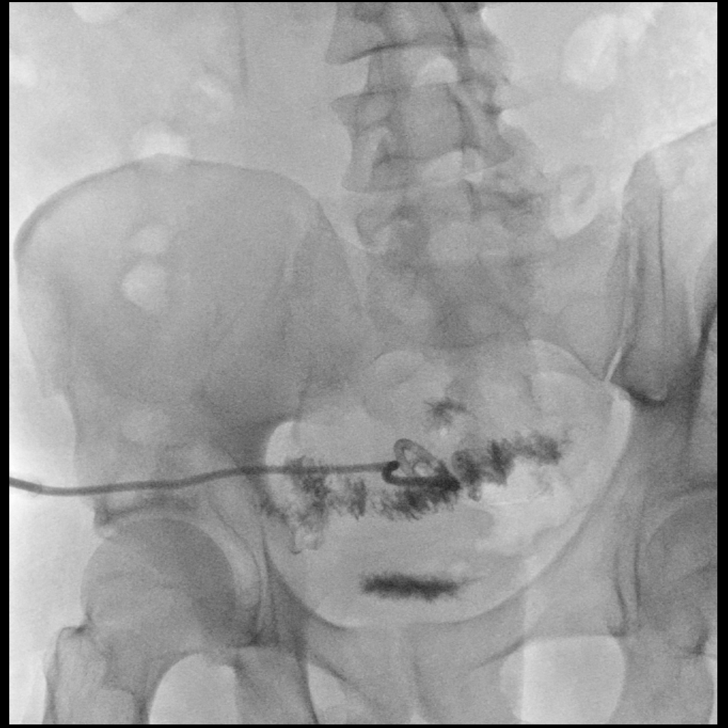
[frame 17/19]
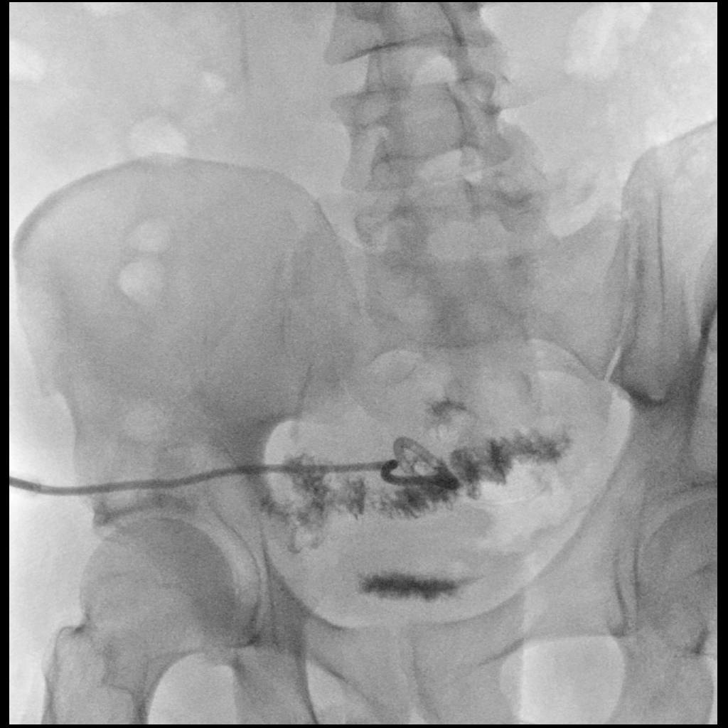

[9 of 9 positions shown; findings below may reference images not displayed]

EXAM:
FLUOROSCOPIC PELVIC ABSCESS DRAIN INJECTION

MEDICATIONS:
NONE.  PATIENT IS AN OUTPATIENT

ANESTHESIA/SEDATION:
None.

COMPLICATIONS:
None immediate.

PROCEDURE:
Existing left trans gluteal pelvic abscess drain was injected with
contrast. Fluoroscopic imaging performed. This was correlated with
today's CT.

Gentle injection of the abscess drain demonstrates a small patent
fistula from the collapsed abscess cavity to the adjacent colon.
Contrast peristalsis throughout the collapsed sigmoid.
IMPRESSION: Positive exam for residual patent fistula to the adjacent colon.

## 2022-01-11 IMAGING — RF DG SINUS / FISTULA TRACT / ABSCESSOGRAM
3 series · 7 of 7 positions shown · non-contrast
Comparison: none

INDICATION: Pelvic abscess drain, outpatient follow-up

[Series 1: one shot · 1 of 1 slices shown (1 of 2)]
[im 1/1]
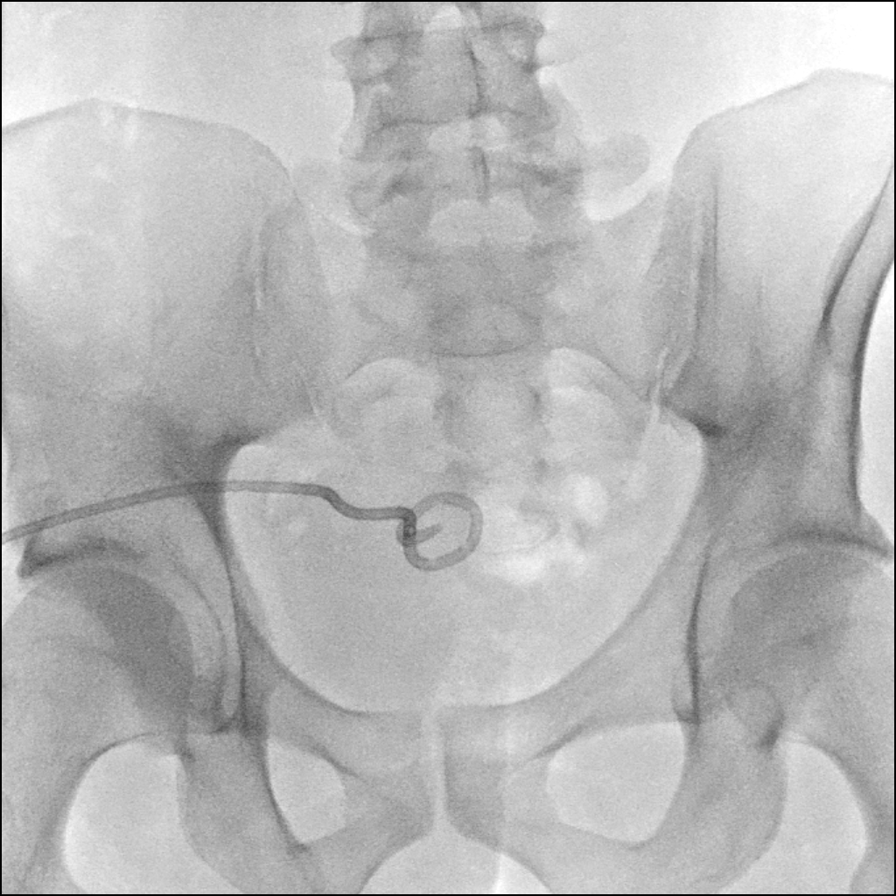

[Series 2: sequence · 4 of 42 frames shown]
[frame 7/42]
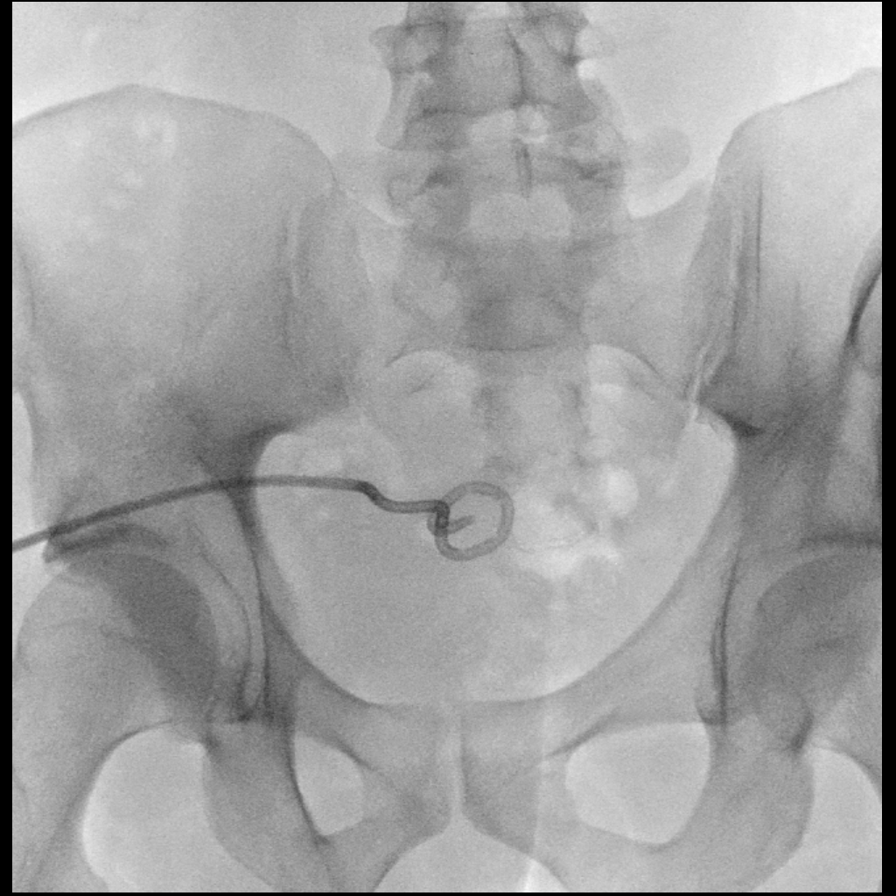
[frame 22/42]
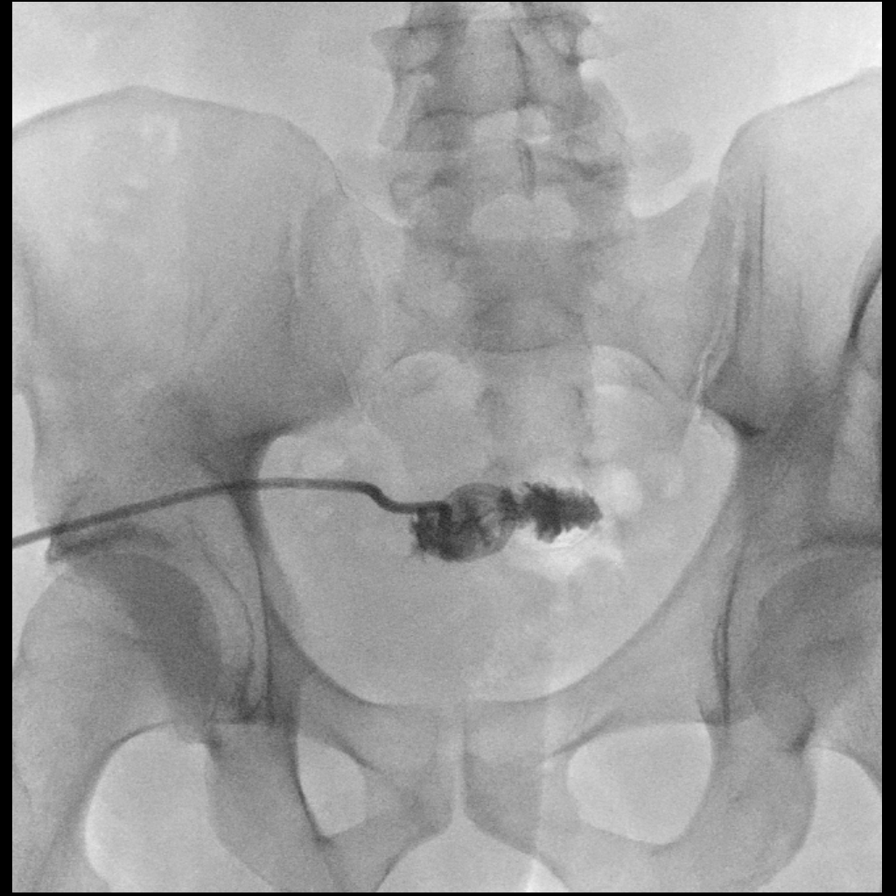
[frame 33/42]
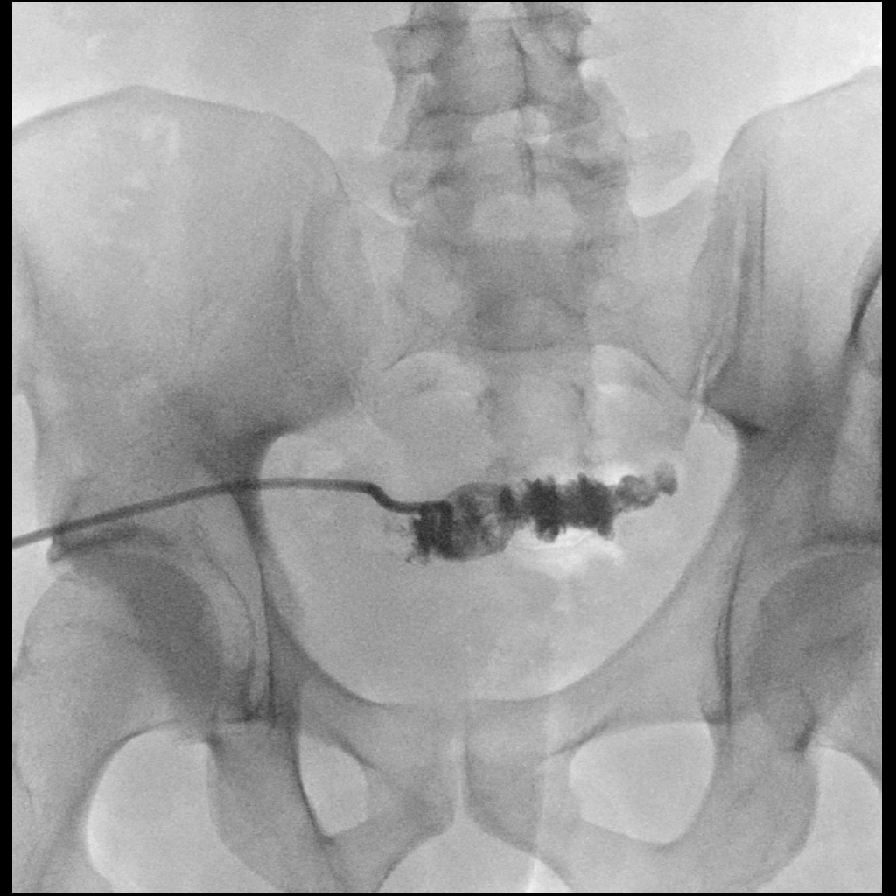
[frame 36/42]
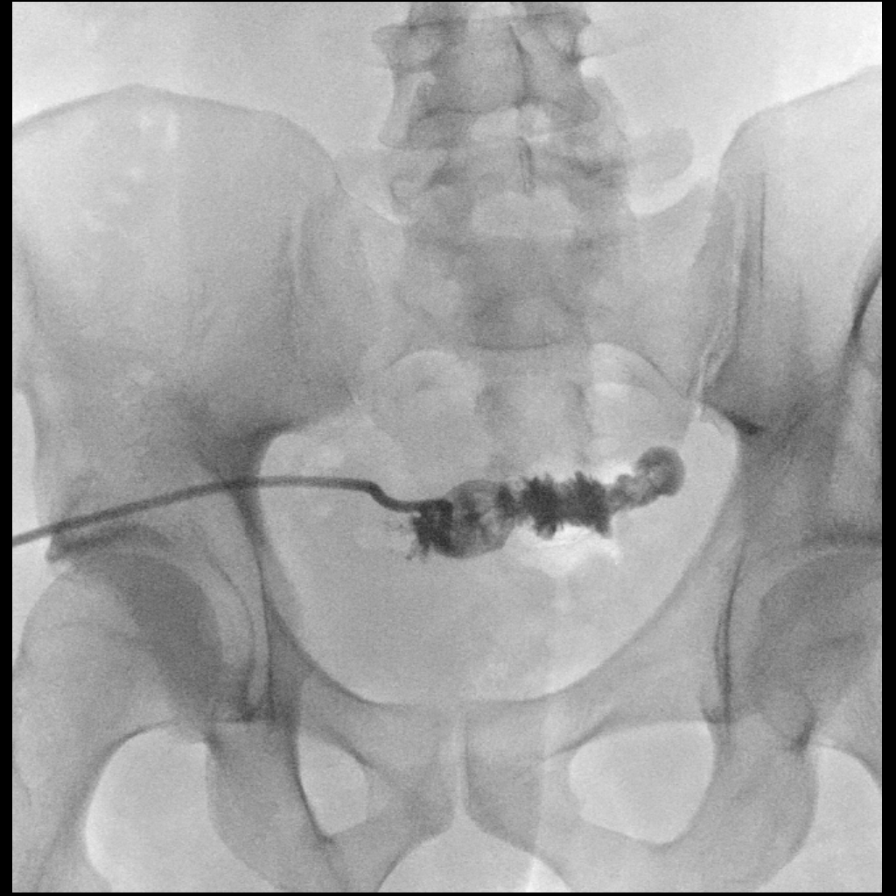

[Series 3: one shot · 2 of 2 slices shown (2 of 2)]
[im 1/2]
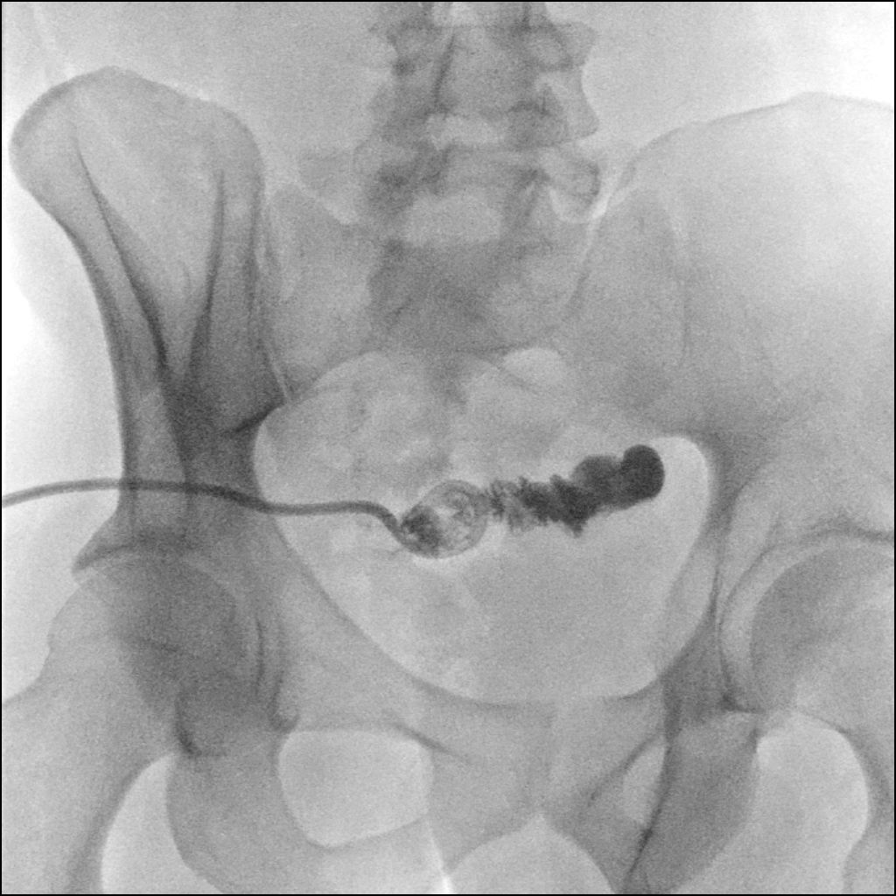
[im 2/2]
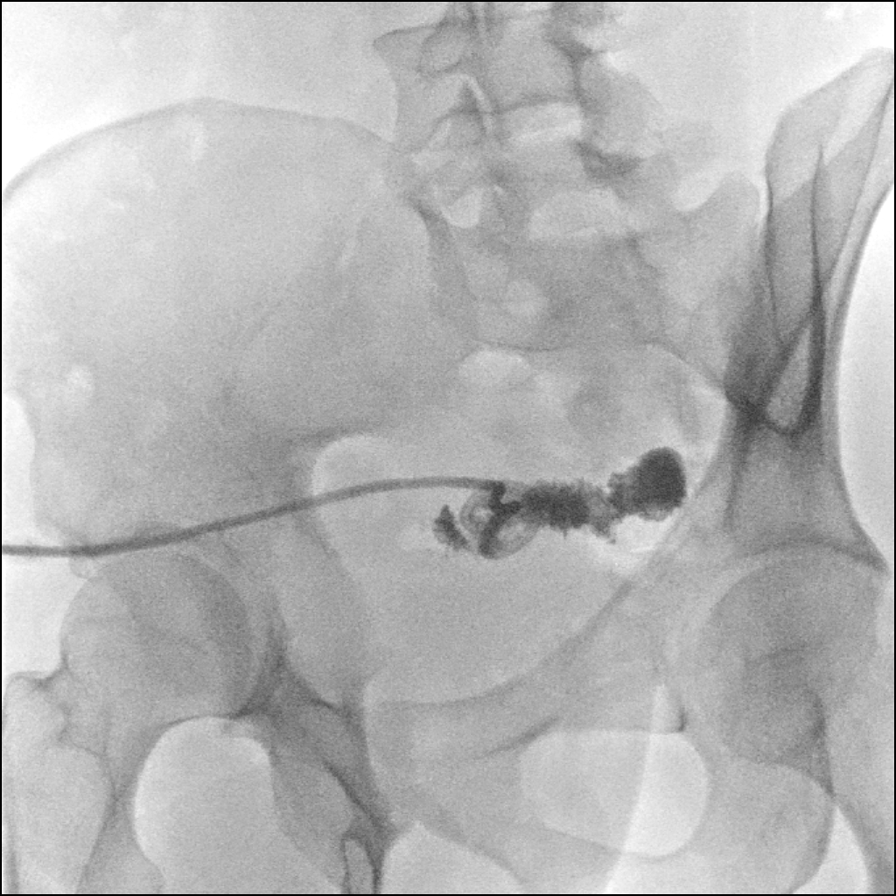

[7 of 7 positions shown; findings below may reference images not displayed]

EXAM:
FLUOROSCOPIC INJECTION OF THE PELVIC ABSCESS DRAIN

MEDICATIONS:
None.

ANESTHESIA/SEDATION:
None.

COMPLICATIONS:
None immediate.

PROCEDURE:
Existing pelvic abscess drain was injected with contrast under
fluoroscopy. Abscess drain has migrated within the sigmoid colon. No
residual abscess cavity outside the colon appreciated by contrast
injection. No leakage appreciated.
IMPRESSION: Pelvic abscess drain has migrated within the sigmoid colon.
